# Patient Record
Sex: Male | Born: 1980 | Race: White | Hispanic: No | Marital: Single | Smoking: Current every day smoker
Health system: Southern US, Community
[De-identification: ages and names within clinical notes are randomized; demographics above are authoritative.]

## PROBLEM LIST (undated history)

## (undated) DIAGNOSIS — B1920 Unspecified viral hepatitis C without hepatic coma: Secondary | ICD-10-CM

## (undated) DIAGNOSIS — I471 Supraventricular tachycardia, unspecified: Secondary | ICD-10-CM

## (undated) DIAGNOSIS — I10 Essential (primary) hypertension: Secondary | ICD-10-CM

---

## 2002-08-24 ENCOUNTER — Emergency Department (HOSPITAL_COMMUNITY): Admission: EM | Admit: 2002-08-24 | Discharge: 2002-08-24 | Payer: Self-pay | Admitting: Emergency Medicine

## 2017-07-13 ENCOUNTER — Emergency Department (HOSPITAL_COMMUNITY): Payer: Self-pay

## 2017-07-13 ENCOUNTER — Encounter (HOSPITAL_COMMUNITY): Payer: Self-pay | Admitting: Emergency Medicine

## 2017-07-13 ENCOUNTER — Emergency Department (HOSPITAL_COMMUNITY)
Admission: EM | Admit: 2017-07-13 | Discharge: 2017-07-14 | Disposition: A | Discharge status: Home / Self Care | Payer: Self-pay | Attending: Emergency Medicine | Admitting: Emergency Medicine

## 2017-07-13 DIAGNOSIS — F1510 Other stimulant abuse, uncomplicated: Secondary | ICD-10-CM | POA: Insufficient documentation

## 2017-07-13 DIAGNOSIS — K920 Hematemesis: Secondary | ICD-10-CM | POA: Insufficient documentation

## 2017-07-13 DIAGNOSIS — F17210 Nicotine dependence, cigarettes, uncomplicated: Secondary | ICD-10-CM | POA: Insufficient documentation

## 2017-07-13 DIAGNOSIS — F419 Anxiety disorder, unspecified: Secondary | ICD-10-CM | POA: Insufficient documentation

## 2017-07-13 DIAGNOSIS — R000 Tachycardia, unspecified: Secondary | ICD-10-CM | POA: Insufficient documentation

## 2017-07-13 DIAGNOSIS — R112 Nausea with vomiting, unspecified: Secondary | ICD-10-CM

## 2017-07-13 LAB — I-STAT TROPONIN, ED: TROPONIN I, POC: 0 ng/mL (ref 0.00–0.08)

## 2017-07-13 LAB — I-STAT BETA HCG BLOOD, ED (MC, WL, AP ONLY): I-stat hCG, quantitative: <5 m[IU]/mL (ref <5)

## 2017-07-13 LAB — BASIC METABOLIC PANEL
ANION GAP: 11 (ref 5–15)
BUN: 11 mg/dL (ref 6–20)
CALCIUM: 9.4 mg/dL (ref 8.9–10.3)
CO2: 26 mmol/L (ref 22–32)
CREATININE: 0.88 mg/dL (ref 0.61–1.24)
Chloride: 99 mmol/L — ABNORMAL LOW (ref 101–111)
GFR calc Af Amer: >60 mL/min (ref >60)
GLUCOSE: 109 mg/dL — AB (ref 65–99)
Potassium: 3.8 mmol/L (ref 3.5–5.1)
Sodium: 136 mmol/L (ref 135–145)

## 2017-07-13 LAB — CBC
HCT: 43.6 % (ref 39.0–52.0)
Hemoglobin: 15.3 g/dL (ref 13.0–17.0)
MCH: 30.2 pg (ref 26.0–34.0)
MCHC: 35.1 g/dL (ref 30.0–36.0)
MCV: 86.2 fL (ref 78.0–100.0)
PLATELETS: 264 10*3/uL (ref 150–400)
RBC: 5.06 MIL/uL (ref 4.22–5.81)
RDW: 12 % (ref 11.5–15.5)
WBC: 7 10*3/uL (ref 4.0–10.5)

## 2017-07-13 MED ORDER — IOPAMIDOL (ISOVUE-300) INJECTION 61%
INTRAVENOUS | Status: AC
  Administered 2017-07-13: 75 mL
  Filled 2017-07-13: qty 75

## 2017-07-13 MED ORDER — SODIUM CHLORIDE 0.9 % IV BOLUS (SEPSIS)
1000.0000 mL | Freq: Once | INTRAVENOUS | Status: AC
  Administered 2017-07-13: 1000 mL via INTRAVENOUS

## 2017-07-13 MED ORDER — ONDANSETRON 4 MG PO TBDP
4.0000 mg | ORAL_TABLET | Freq: Once | ORAL | Status: AC
  Administered 2017-07-13: 4 mg via ORAL
  Filled 2017-07-13: qty 1

## 2017-07-13 MED ORDER — ONDANSETRON HCL 4 MG/2ML IJ SOLN
4.0000 mg | Freq: Once | INTRAMUSCULAR | Status: AC
  Administered 2017-07-13: 4 mg via INTRAVENOUS
  Filled 2017-07-13: qty 2

## 2017-07-13 NOTE — ED Triage Notes (Signed)
Reports having sudden onset chest pain in center of the chest with nausea and vomiting.  Noted to be vomiting in triage.

## 2017-07-13 NOTE — ED Notes (Signed)
Pt reports doing some meth this afternoon and now he is vomiting with blood.

## 2017-07-14 LAB — RAPID URINE DRUG SCREEN, HOSP PERFORMED
AMPHETAMINES: POSITIVE — AB
BARBITURATES: NOT DETECTED
Benzodiazepines: NOT DETECTED
Cocaine: NOT DETECTED
OPIATES: NOT DETECTED
TETRAHYDROCANNABINOL: NOT DETECTED

## 2017-07-14 MED ORDER — ONDANSETRON 4 MG PO TBDP: 4.0000 mg | ORAL_TABLET | Freq: Three times a day (TID) | ORAL | 0 refills | Status: DC | PRN

## 2017-07-14 MED ORDER — FAMOTIDINE 40 MG PO TABS: 40.0000 mg | ORAL_TABLET | Freq: Every day | ORAL | 0 refills | Status: DC

## 2017-07-14 NOTE — ED Provider Notes (Signed)
[facility] EMERGENCY DEPARTMENT Provider Note   CSN: [id number] Arrival date & time: 07/13/17  1953     History   Chief Complaint Chief Complaint  Patient presents with  . Chest Pain    HPI Alexander Mckenzie is a 37 y.o. male.  HPI Patient presents with chest pain nausea and vomiting.  Has been vomiting up some blood also.  Began after he took methamphetamine.  Dull chest pain.  States he feels his heart racing.  No fevers or chills.  No other bleeding.  Has not had episodes like this before.  No known heart disease. History reviewed. No pertinent past medical history.  There are no active problems to display for this patient.   History reviewed. No pertinent surgical history.     Home Medications    Prior to Admission medications   Medication Sig Start Date End Date Taking? Authorizing Provider  famotidine (PEPCID) 40 MG tablet Take 1 tablet (40 mg total) by mouth daily. 07/14/17   Benjiman Core, MD  ondansetron (ZOFRAN-ODT) 4 MG disintegrating tablet Take 1 tablet (4 mg total) by mouth every 8 (eight) hours as needed for nausea or vomiting. 07/14/17   Benjiman Core, MD    Family History No family history on file.  Social History Social History   Tobacco Use  . Smoking status: Current Every Day Smoker    Types: Cigarettes  . Smokeless tobacco: Never Used  Substance Use Topics  . Alcohol use: No    Frequency: Never  . Drug use: Yes    Types: Marijuana     Allergies   Keflex [cephalexin]   Review of Systems Review of Systems  Constitutional: Negative for activity change and fever.  HENT: Negative for congestion.   Respiratory: Negative for shortness of breath.   Cardiovascular: Positive for chest pain.  Endocrine: Negative for polyuria.  Genitourinary: Negative for flank pain.  Musculoskeletal: Negative for back pain.  Skin: Negative for rash.  Neurological: Negative for syncope.  Hematological: Negative for adenopathy.    Psychiatric/Behavioral: Negative for confusion.     Physical Exam Updated Vital Signs BP (!) 156/71   Pulse (!) 43   Temp 99 F (37.2 C) (Oral)   Resp 19   Ht 6\' 3"  (1.905 m)   Wt 113.4 kg (250 lb)   SpO2 90%   BMI 31.25 kg/m   Physical Exam  Constitutional: He appears well-developed.  HENT:  Head: Atraumatic.  Eyes: Pupils are equal, round, and reactive to light.  Neck: Neck supple.  Cardiovascular: Normal pulses.  Mild tachycardia  Pulmonary/Chest: Effort normal and breath sounds normal.  Musculoskeletal:       Right lower leg: He exhibits no edema.       Left lower leg: He exhibits no edema.  Neurological: He is alert.  Skin: Skin is warm. Capillary refill takes less than 2 seconds.  Psychiatric: His mood appears anxious.     ED Treatments / Results  Labs (all labs ordered are listed, but only abnormal results are displayed) Labs Reviewed  BASIC METABOLIC PANEL - Abnormal; Notable for the following components:      Result Value   Chloride 99 (*)    Glucose, Bld 109 (*)    All other components within normal limits  CBC  RAPID URINE DRUG SCREEN, HOSP PERFORMED  I-STAT TROPONIN, ED  I-STAT BETA HCG BLOOD, ED ([facility], [facility], [facility] ONLY)    EKG  EKG Interpretation  Date/Time:  Sunday July 13 2017 19:55:42  EDT Ventricular Rate:  129 PR Interval:  150 QRS Duration: 86 QT Interval:  304 QTC Calculation: 445 R Axis:   72 Text Interpretation:  Sinus tachycardia Otherwise normal ECG Confirmed by Benjiman CorePickering, Nesta Kimple [phone]([id number]) on 07/14/2017 12:06:54 AM       Radiology Dg Chest 2 View  Result Date: 07/13/2017 CLINICAL DATA:  37 year old male with chest pain. EXAM: CHEST - 2 VIEW COMPARISON:  None. FINDINGS: The heart size and mediastinal contours are within normal limits. Both lungs are clear. The visualized skeletal structures are unremarkable. IMPRESSION: No active cardiopulmonary disease. Electronically Signed   By: Elgie CollardArash  Radparvar M.D.   On: 07/13/2017 20:36   Ct  Chest W Contrast  Result Date: 07/13/2017 CLINICAL DATA:  37 year old male with chest pain, nausea vomiting, and hematemesis. EXAM: CT CHEST WITH CONTRAST TECHNIQUE: Multidetector CT imaging of the chest was performed during intravenous contrast administration. CONTRAST:  75mL ISOVUE-300 IOPAMIDOL (ISOVUE-300) INJECTION 61% COMPARISON:  Chest radiograph dated 07/13/2017 FINDINGS: Cardiovascular: There is no cardiomegaly or pericardial effusion. Coronary vascular calcification primarily involving the LAD noted which is advanced for the patient's age. Clinical correlation is recommended. The thoracic aorta and the central pulmonary arteries appear unremarkable. Evaluation for pulmonary embolus is very limited due to suboptimal opacification. If there is clinical concern for PE further evaluation with V/Q scan is recommended as this study is nondiagnostic for PE. Mediastinum/Nodes: There is no hilar or mediastinal adenopathy. Esophagus and the thyroid gland are grossly unremarkable. No mediastinal fluid collection. Lungs/Pleura: There is a 4 mm subpleural nodule or scarring along the minor fissure (series 4 image 75). The lungs are clear. There is no pleural effusion or pneumothorax. The central airways are patent. Upper Abdomen: No acute abnormality. Musculoskeletal: No chest wall abnormality. No acute or significant osseous findings. IMPRESSION: No acute intrathoracic pathology. Nondiagnostic exam for evaluation of pulmonary arteries. If there is clinical concern for PE further evaluation with V/Q scan is recommended. Coronary vascular calcifications of the LAD, advanced for the patient's age. Clinical correlation is recommended. Electronically Signed   By: Elgie CollardArash  Radparvar M.D.   On: 07/13/2017 23:43    Procedures Procedures (including critical care time)  Medications Ordered in ED Medications  ondansetron (ZOFRAN-ODT) disintegrating tablet 4 mg (4 mg Oral Given 07/13/17 2008)  ondansetron (ZOFRAN)  injection 4 mg (4 mg Intravenous Given 07/13/17 2219)  sodium chloride 0.9 % bolus 1,000 mL (0 mLs Intravenous Stopped 07/13/17 2326)  iopamidol (ISOVUE-300) 61 % injection (75 mLs  Contrast Given 07/13/17 2313)     Initial Impression / Assessment and Plan / ED Course  I have reviewed the triage vital signs and the nursing notes.  Pertinent labs & imaging results that were available during my care of the patient were reviewed by me and considered in my medical decision making (see chart for details).    Patient with chest pain nausea and vomiting.  Began after methamphetamine use.  Vitals improved with time.  Hemoglobin and labs reassuring.  EKG reassuring.  CT scan done due to hematemesis to rule out a Boerhaave's.  But no evidence of Boerhaave's on the scan.  Patient was informed about his LAD calcifications.  Will discharge home.  Will treat with Zofran and Pepcid for his gastritis versus Mallory-Weiss   Final Clinical Impressions(s) / ED Diagnoses   Final diagnoses:  Hematemesis, presence of nausea not specified  Nausea and vomiting, intractability of vomiting not specified, unspecified vomiting type  Methamphetamine abuse [facility])    ED Discharge Orders  Ordered    famotidine (PEPCID) 40 MG tablet  Daily     07/14/17 0002    ondansetron (ZOFRAN-ODT) 4 MG disintegrating tablet  Every 8 hours PRN     07/14/17 0002       Benjiman Core, MD 07/14/17 0008

## 2018-11-15 DIAGNOSIS — R000 Tachycardia, unspecified: Secondary | ICD-10-CM | POA: Insufficient documentation

## 2018-12-28 IMAGING — CT CT CHEST W/ CM
2 of 4 series · 15 of 36 positions shown, 18 images · IV contrast (APPLIED)
Comparison: Chest radiograph dated 07/13/2017

CLINICAL DATA: 36-year-old male with chest pain, nausea vomiting,
and hematemesis.

EXAM:
CT CHEST WITH CONTRAST
TECHNIQUE: Multidetector CT imaging of the chest was performed during
intravenous contrast administration.
CONTRAST:  75mL FFCSUQ-322 IOPAMIDOL (FFCSUQ-322) INJECTION 61%

[Series 3: chest w · axial · 0.83mm/px · z∈[-185,+115]mm · 12 of 176 slices shown, 15 images]
[im 13/176  mediastinal]
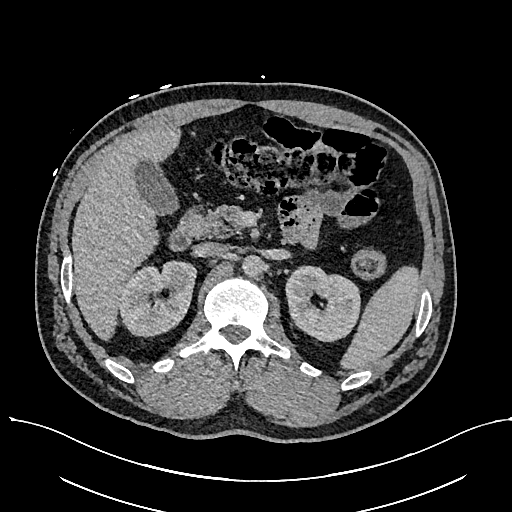
[im 13/176  lung]
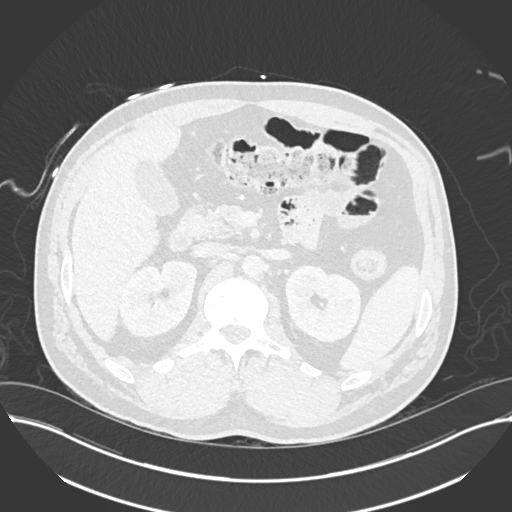
[im 26/176  lung]
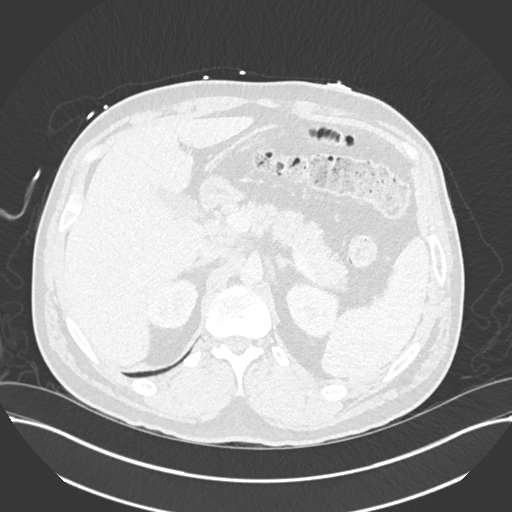
[im 38/176  lung]
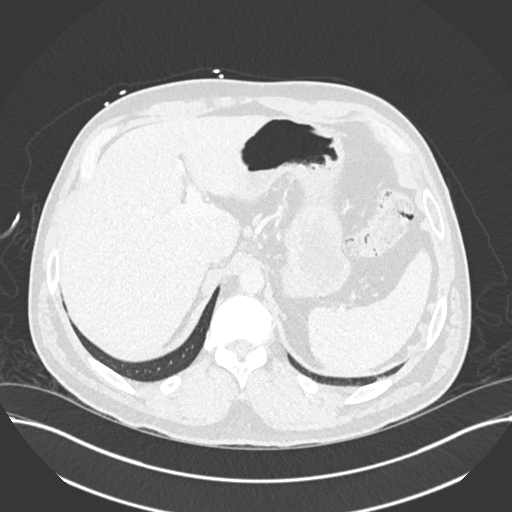
[im 51/176  lung]
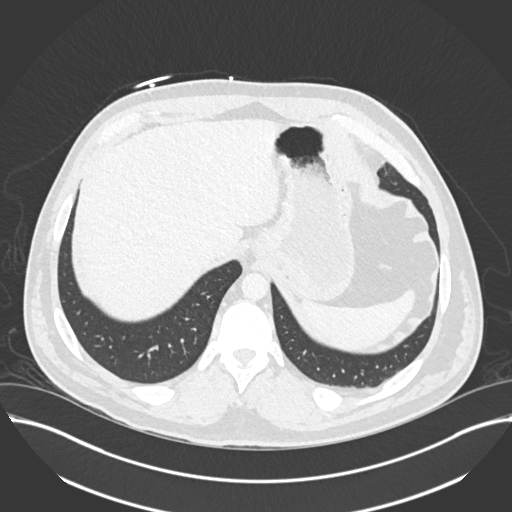
[im 63/176  mediastinal]
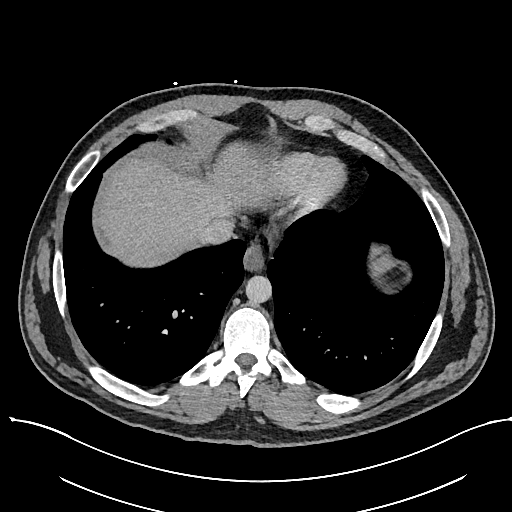
[im 63/176  lung]
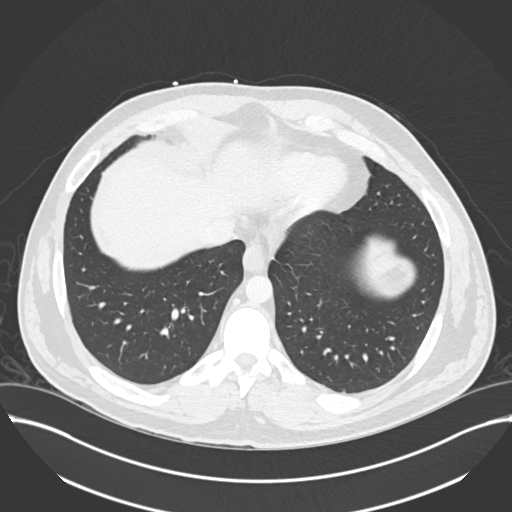
[im 76/176  lung]
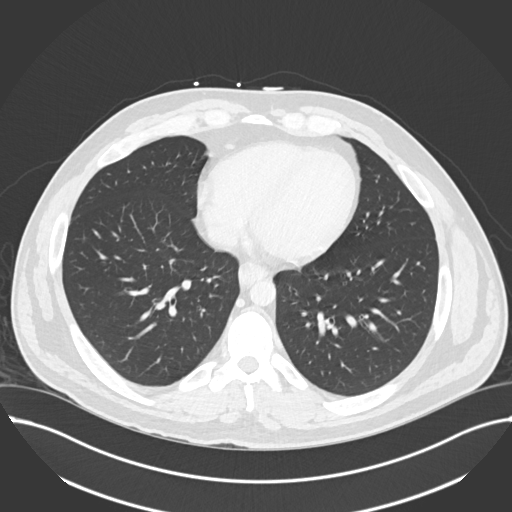
[im 101/176  lung]
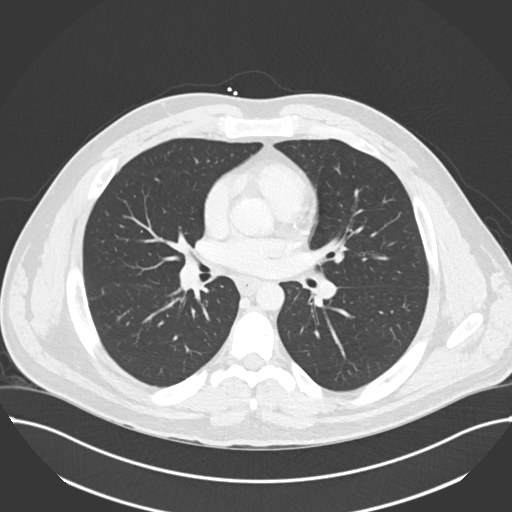
[im 113/176  lung]
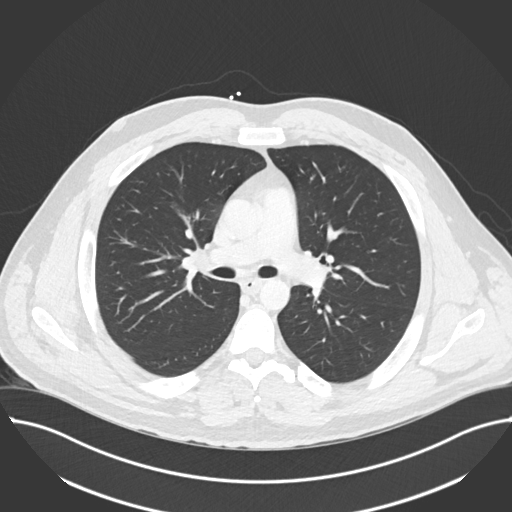
[im 126/176  mediastinal]
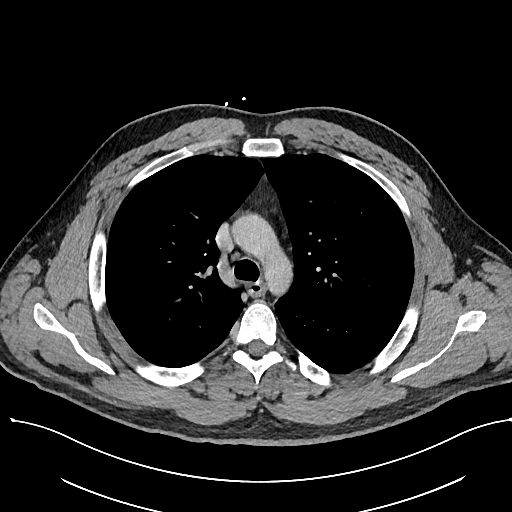
[im 126/176  lung]
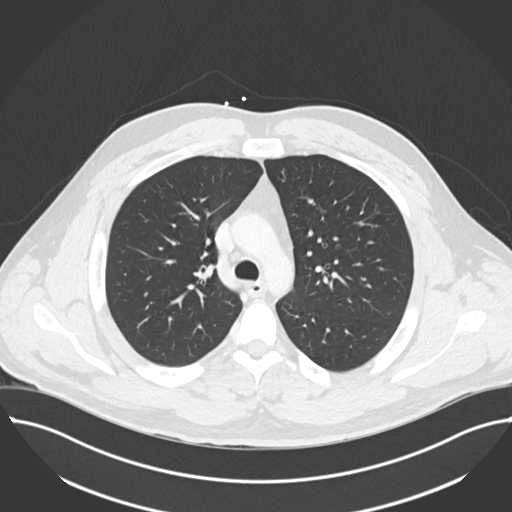
[im 138/176  lung]
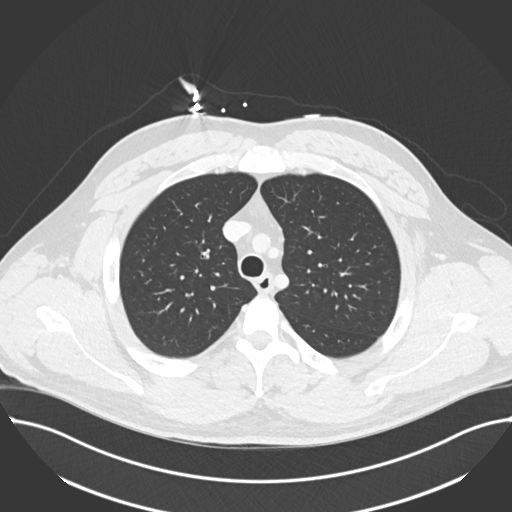
[im 151/176  lung]
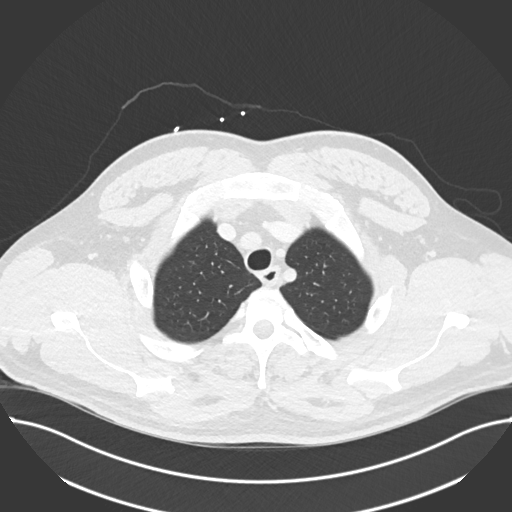
[im 163/176  lung]
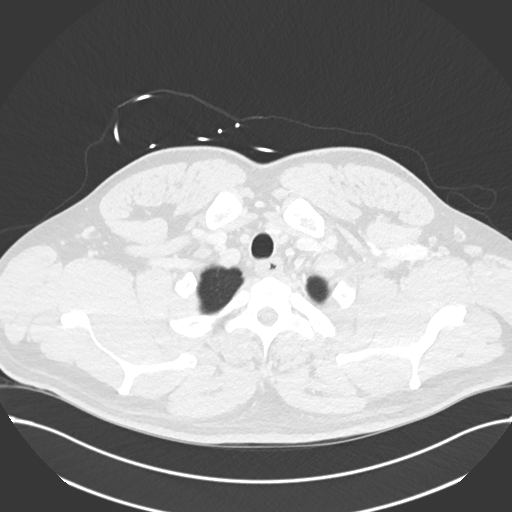

[Series 6: cor · coronal · 0.71mm/px · 3 of 142 slices shown]
[im 29/142  lung]
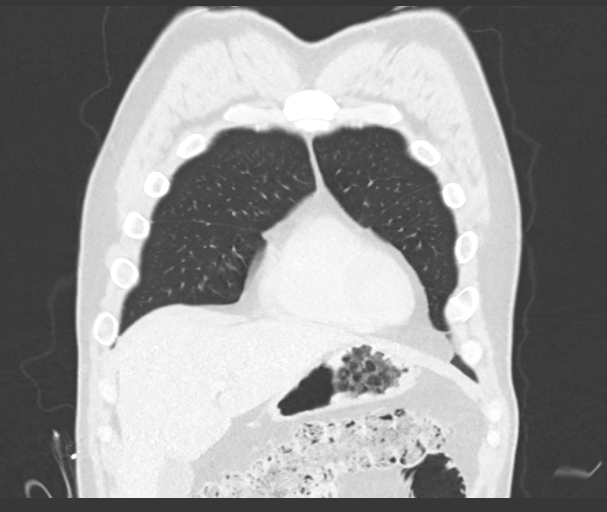
[im 57/142  lung]
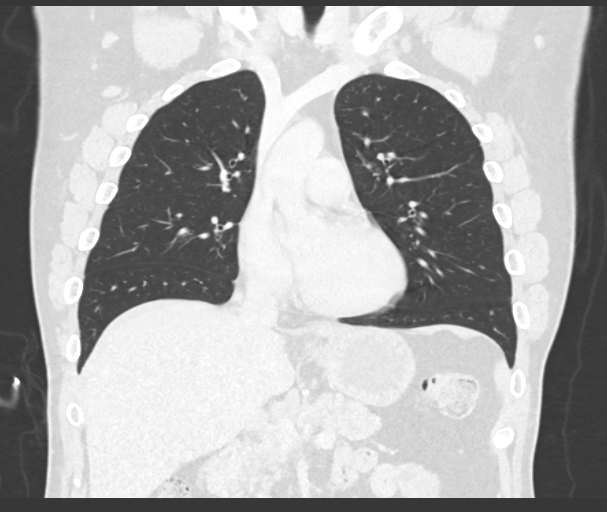
[im 85/142  lung]
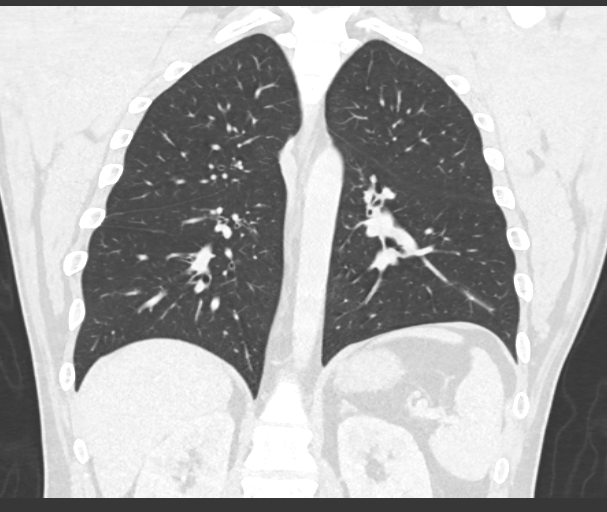

[15 of 36 positions shown; findings below may reference images not displayed]

FINDINGS: Cardiovascular: There is no cardiomegaly or pericardial effusion.
Coronary vascular calcification primarily involving the LAD noted
which is advanced for the patient's age. Clinical correlation is
recommended. The thoracic aorta and the central pulmonary arteries
appear unremarkable. Evaluation for pulmonary embolus is very
limited due to suboptimal opacification. If there is clinical
concern for PE further evaluation with V/Q scan is recommended as
this study is nondiagnostic for PE.

Mediastinum/Nodes: There is no hilar or mediastinal adenopathy.
Esophagus and the thyroid gland are grossly unremarkable. No
mediastinal fluid collection.

Lungs/Pleura: There is a 4 mm subpleural nodule or scarring along
the minor fissure (series 4 image 75). The lungs are clear. There is
no pleural effusion or pneumothorax. The central airways are patent.

Upper Abdomen: No acute abnormality.

Musculoskeletal: No chest wall abnormality. No acute or significant
osseous findings.
IMPRESSION: No acute intrathoracic pathology. Nondiagnostic exam for evaluation
of pulmonary arteries. If there is clinical concern for PE further
evaluation with V/Q scan is recommended.

Coronary vascular calcifications of the LAD, advanced for the
patient's age. Clinical correlation is recommended.

## 2018-12-28 IMAGING — DX DG CHEST 2V
2 series · 2 of 2 positions shown · non-contrast
Comparison: None.

CLINICAL DATA: 36-year-old female with chest pain.

EXAM:
CHEST - 2 VIEW

[chest pa]
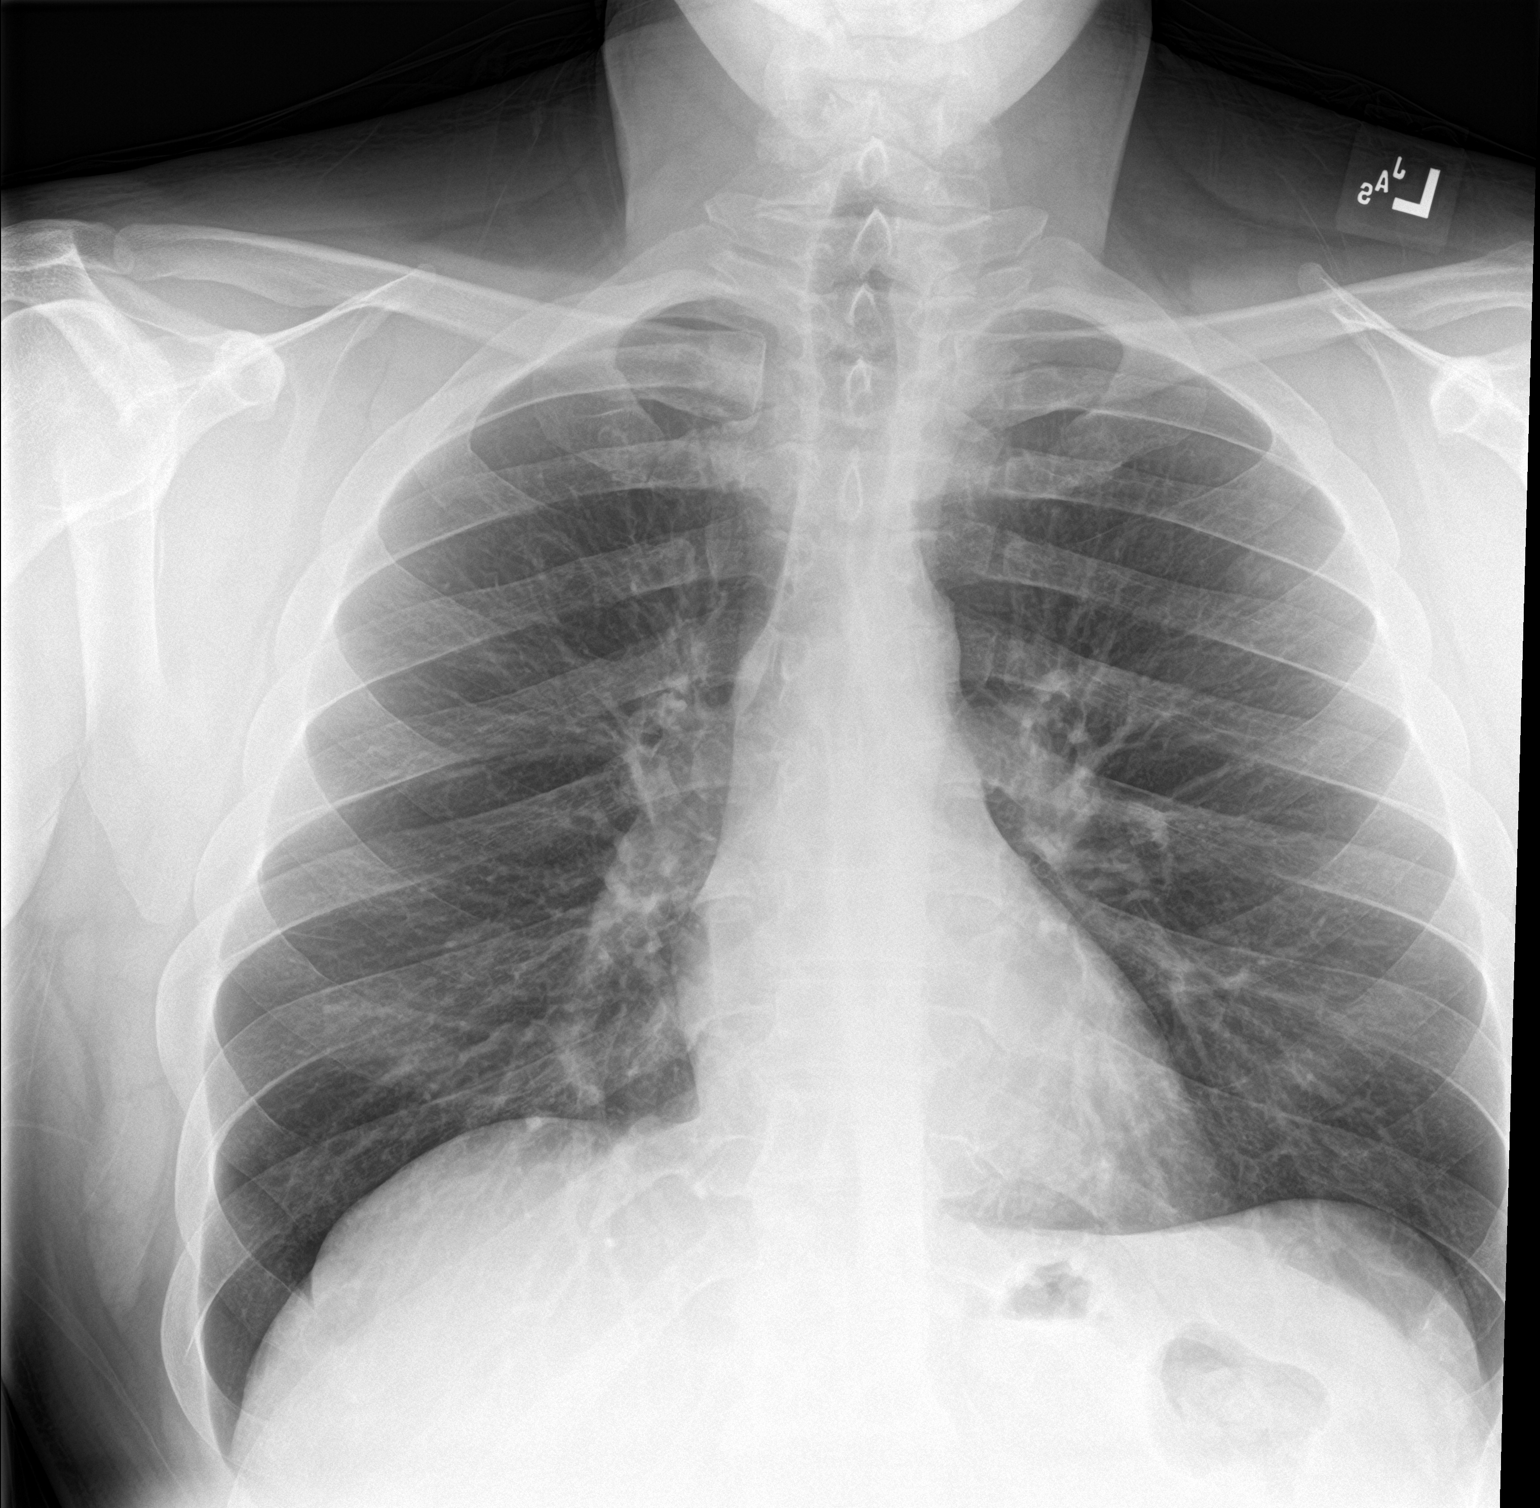

[chest lat]
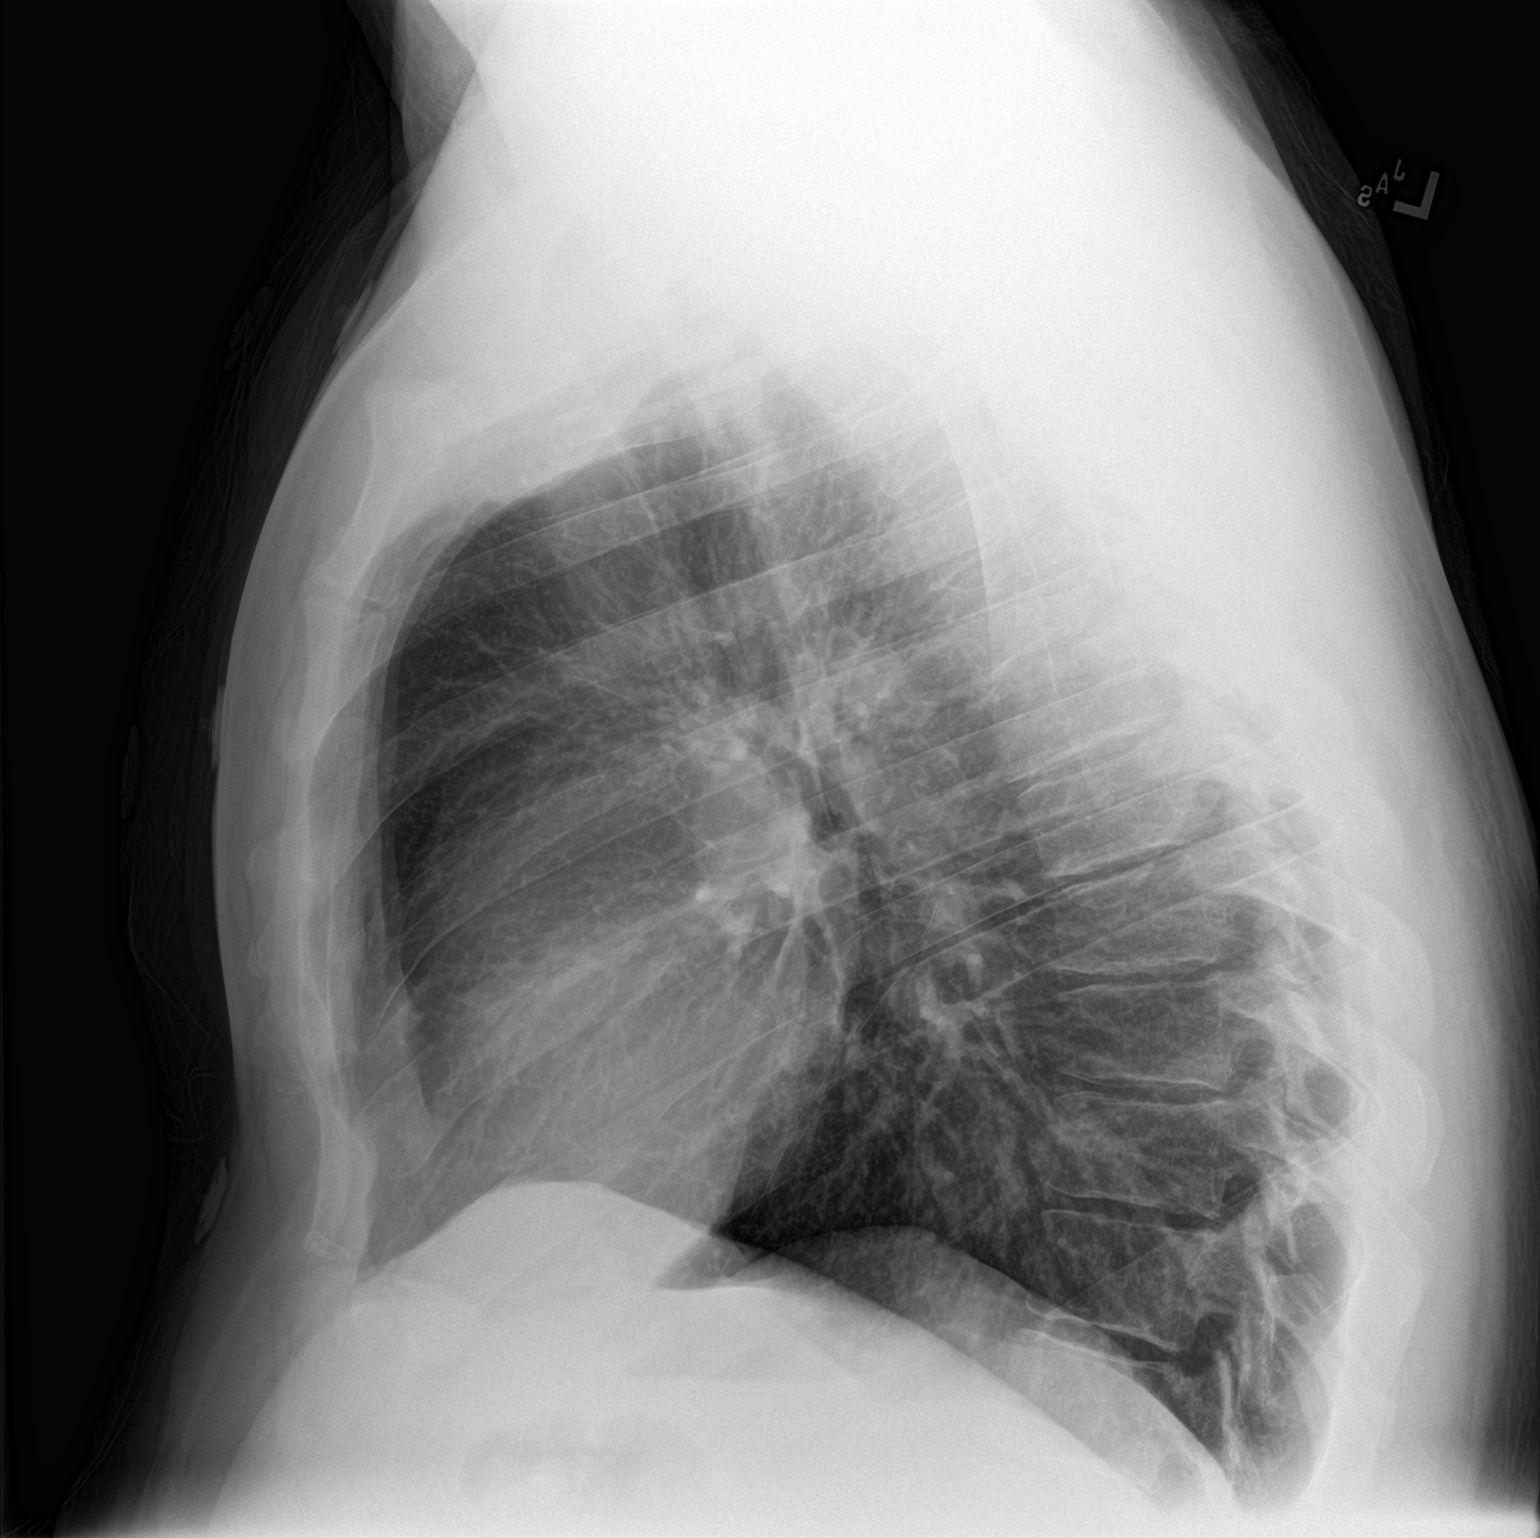

[2 of 2 positions shown; findings below may reference images not displayed]

FINDINGS: The heart size and mediastinal contours are within normal limits.
Both lungs are clear. The visualized skeletal structures are
unremarkable.
IMPRESSION: No active cardiopulmonary disease.

## 2020-01-10 DIAGNOSIS — R4182 Altered mental status, unspecified: Secondary | ICD-10-CM | POA: Insufficient documentation

## 2020-01-10 DIAGNOSIS — K37 Unspecified appendicitis: Secondary | ICD-10-CM | POA: Insufficient documentation

## 2023-04-21 ENCOUNTER — Other Ambulatory Visit: Payer: Self-pay

## 2023-04-21 ENCOUNTER — Encounter (HOSPITAL_COMMUNITY): Payer: Self-pay

## 2023-04-21 ENCOUNTER — Emergency Department (HOSPITAL_COMMUNITY)
Admission: EM | Admit: 2023-04-21 | Discharge: 2023-04-21 | Disposition: A | Discharge status: Home / Self Care | Payer: Self-pay | Attending: Emergency Medicine | Admitting: Emergency Medicine

## 2023-04-21 ENCOUNTER — Emergency Department (HOSPITAL_BASED_OUTPATIENT_CLINIC_OR_DEPARTMENT_OTHER): Payer: Self-pay

## 2023-04-21 ENCOUNTER — Emergency Department (HOSPITAL_COMMUNITY): Payer: Self-pay

## 2023-04-21 DIAGNOSIS — R739 Hyperglycemia, unspecified: Secondary | ICD-10-CM | POA: Insufficient documentation

## 2023-04-21 DIAGNOSIS — I10 Essential (primary) hypertension: Secondary | ICD-10-CM | POA: Insufficient documentation

## 2023-04-21 DIAGNOSIS — R000 Tachycardia, unspecified: Secondary | ICD-10-CM | POA: Insufficient documentation

## 2023-04-21 DIAGNOSIS — R002 Palpitations: Secondary | ICD-10-CM | POA: Insufficient documentation

## 2023-04-21 DIAGNOSIS — R609 Edema, unspecified: Secondary | ICD-10-CM

## 2023-04-21 DIAGNOSIS — R600 Localized edema: Secondary | ICD-10-CM | POA: Insufficient documentation

## 2023-04-21 DIAGNOSIS — R0789 Other chest pain: Secondary | ICD-10-CM | POA: Insufficient documentation

## 2023-04-21 DIAGNOSIS — R0602 Shortness of breath: Secondary | ICD-10-CM | POA: Insufficient documentation

## 2023-04-21 HISTORY — DX: Essential (primary) hypertension: I10

## 2023-04-21 HISTORY — DX: Unspecified viral hepatitis C without hepatic coma: B19.20

## 2023-04-21 HISTORY — DX: Supraventricular tachycardia, unspecified: I47.10

## 2023-04-21 LAB — CBC WITH DIFFERENTIAL/PLATELET
Abs Immature Granulocytes: 0.01 10*3/uL (ref 0.00–0.07)
Basophils Absolute: 0 10*3/uL (ref 0.0–0.1)
Basophils Relative: 1 %
Eosinophils Absolute: 0.1 10*3/uL (ref 0.0–0.5)
Eosinophils Relative: 2 %
HCT: 34.3 % — ABNORMAL LOW (ref 39.0–52.0)
Hemoglobin: 11.6 g/dL — ABNORMAL LOW (ref 13.0–17.0)
Immature Granulocytes: 0 %
Lymphocytes Relative: 44 %
Lymphs Abs: 1.9 10*3/uL (ref 0.7–4.0)
MCH: 32.6 pg (ref 26.0–34.0)
MCHC: 33.8 g/dL (ref 30.0–36.0)
MCV: 96.3 fL (ref 80.0–100.0)
Monocytes Absolute: 0.5 10*3/uL (ref 0.1–1.0)
Monocytes Relative: 12 %
Neutro Abs: 1.8 10*3/uL (ref 1.7–7.7)
Neutrophils Relative %: 41 %
Platelets: 52 10*3/uL — ABNORMAL LOW (ref 150–400)
RBC: 3.56 MIL/uL — ABNORMAL LOW (ref 4.22–5.81)
RDW: 14.6 % (ref 11.5–15.5)
WBC: 4.3 10*3/uL (ref 4.0–10.5)
nRBC: 0 % (ref 0.0–0.2)

## 2023-04-21 LAB — BASIC METABOLIC PANEL
Anion gap: 5 (ref 5–15)
BUN: 14 mg/dL (ref 6–20)
CO2: 19 mmol/L — ABNORMAL LOW (ref 22–32)
Calcium: 8.2 mg/dL — ABNORMAL LOW (ref 8.9–10.3)
Chloride: 111 mmol/L (ref 98–111)
Creatinine, Ser: 0.96 mg/dL (ref 0.61–1.24)
GFR, Estimated: >60 mL/min (ref >60)
Glucose, Bld: 132 mg/dL — ABNORMAL HIGH (ref 70–99)
Potassium: 3.8 mmol/L (ref 3.5–5.1)
Sodium: 135 mmol/L (ref 135–145)

## 2023-04-21 LAB — D-DIMER, QUANTITATIVE: D-Dimer, Quant: 0.32 ug{FEU}/mL (ref 0.00–0.50)

## 2023-04-21 LAB — TROPONIN I (HIGH SENSITIVITY)
Troponin I (High Sensitivity): 6 ng/L (ref <18)
Troponin I (High Sensitivity): 7 ng/L (ref <18)

## 2023-04-21 LAB — BRAIN NATRIURETIC PEPTIDE: B Natriuretic Peptide: 8 pg/mL (ref 0.0–100.0)

## 2023-04-21 LAB — CK: Total CK: 243 U/L (ref 49–397)

## 2023-04-21 MED ORDER — LORAZEPAM 1 MG PO TABS
1.0000 mg | ORAL_TABLET | Freq: Once | ORAL | Status: AC
  Administered 2023-04-21: 1 mg via ORAL
  Filled 2023-04-21: qty 1

## 2023-04-21 NOTE — ED Triage Notes (Signed)
Pt bib ems from a [employer], pt was recently released from Prison last week after spending 5 years there. Pt was doing some chores this morning and suddenly developed chest pain. Pt has hx of SVT, when fire arrived his Hr was 150, when EMS got there it was down to 110. Pt also reports swelling in his legs x2 weeks, was given compression socks in prison with some relief but states his legs aren't his normal. Pt also c.o full body cramping. States he gets monthly suboxone injections and he is due for one this month. Pt appears anxious.

## 2023-04-21 NOTE — Discharge Instructions (Signed)
It was a pleasure take care of you today.  As discussed, your workup was reassuring.  Your cardiac markers were normal.  Please follow-up with [facility] for further evaluation.  Return to the ER for any worsening symptoms.

## 2023-04-21 NOTE — ED Provider Notes (Signed)
[facility] EMERGENCY DEPARTMENT AT [facility] Provider Note   CSN: [id number] Arrival date & time: 04/21/23  0746     History  Chief Complaint  Patient presents with   Chest Pain   Anxiety    Alexander Mckenzie is a 42 y.o. male with a past medical history significant for hepatitis C, hypertension, history of SVT who presents to the ED due to palpitations and chest pain that started this morning.  Patient states after breakfast he was taking out the trash and developed sudden onset of palpitations.  He describes palpitations as heart racing.  When fire got to patient his heart was in the 150s.  Patient also admits to bilateral lower extremity edema and shortness of breath.  No history of blood clots.  Patient states his whole body is currently cramping.  Chest pain and palpitations have resolved.  Recently in prison and released a few days ago.  Currently in a halfway house.  History obtained from patient and past medical records. No interpreter used during encounter.       Home Medications Prior to Admission medications   Medication Sig Start Date End Date Taking? Authorizing Provider  famotidine (PEPCID) 40 MG tablet Take 1 tablet (40 mg total) by mouth daily. 07/14/17   Benjiman Core, MD  ondansetron (ZOFRAN-ODT) 4 MG disintegrating tablet Take 1 tablet (4 mg total) by mouth every 8 (eight) hours as needed for nausea or vomiting. 07/14/17   Benjiman Core, MD      Allergies    Keflex [cephalexin]    Review of Systems   Review of Systems  Constitutional:  Negative for chills and fever.  Respiratory:  Positive for shortness of breath.   Cardiovascular:  Positive for chest pain, palpitations and leg swelling.  Musculoskeletal:  Positive for myalgias.    Physical Exam Updated Vital Signs BP 108/62   Pulse 85   Temp 98.9 F (37.2 C) (Temporal)   Resp 12   Ht 6\' 4"  (1.93 m)   Wt 108.9 kg   SpO2 97%   BMI 29.21 kg/m  Physical Exam Vitals and nursing note  reviewed.  Constitutional:      General: He is not in acute distress.    Appearance: He is not ill-appearing.  HENT:     Head: Normocephalic.  Eyes:     Pupils: Pupils are equal, round, and reactive to light.  Cardiovascular:     Rate and Rhythm: Regular rhythm. Tachycardia present.     Pulses: Normal pulses.     Heart sounds: Normal heart sounds. No murmur heard.    No friction rub. No gallop.  Pulmonary:     Effort: Pulmonary effort is normal.     Breath sounds: Normal breath sounds.     Comments: Respirations equal and unlabored, patient able to speak in full sentences, lungs clear to auscultation bilaterally Abdominal:     General: Abdomen is flat. There is no distension.     Palpations: Abdomen is soft.     Tenderness: There is no abdominal tenderness. There is no guarding or rebound.  Musculoskeletal:        General: Normal range of motion.     Cervical back: Neck supple.     Comments: 2+ pitting edema bilaterally  Skin:    General: Skin is warm and dry.  Neurological:     General: No focal deficit present.     Mental Status: He is alert.  Psychiatric:  Mood and Affect: Mood normal.        Behavior: Behavior normal.     ED Results / Procedures / Treatments   Labs (all labs ordered are listed, but only abnormal results are displayed) Labs Reviewed  CBC WITH DIFFERENTIAL/PLATELET - Abnormal; Notable for the following components:      Result Value   RBC 3.56 (*)    Hemoglobin 11.6 (*)    HCT 34.3 (*)    Platelets 52 (*)    All other components within normal limits  BASIC METABOLIC PANEL - Abnormal; Notable for the following components:   CO2 19 (*)    Glucose, Bld 132 (*)    Calcium 8.2 (*)    All other components within normal limits  BRAIN NATRIURETIC PEPTIDE  CK  D-DIMER, QUANTITATIVE  TROPONIN I (HIGH SENSITIVITY)  TROPONIN I (HIGH SENSITIVITY)    EKG EKG Interpretation Date/Time:  Monday April 21 2023 07:47:16 EST Ventricular Rate:   121 PR Interval:  142 QRS Duration:  86 QT Interval:  340 QTC Calculation: 482 R Axis:   53  Text Interpretation: Sinus tachycardia Otherwise normal ECG No significant change was found Confirmed by Elayne Snare ([id number]) on 04/21/2023 9:00:16 AM  Radiology VAS [place] LOWER EXTREMITY VENOUS (DVT) Result Date: 04/21/2023  Lower Venous DVT Study Patient Name:  Alexander Mckenzie  Date of Exam:   04/21/2023 Medical Rec #: [id number]     Accession #:    [id number] Date of Birth: 1980/07/24     Patient Gender: M Patient Age:   32 years Exam Location:  [facility] Procedure:      VAS [place] LOWER EXTREMITY VENOUS (DVT) Referring Phys: Claudette Stapler --------------------------------------------------------------------------------  Indications: Edema, chest pain, and Swelling. Other Indications: H/O SVT. Comparison Study: No prior exam. Performing Technologist: Fernande Bras  Examination Guidelines: A complete evaluation includes B-mode imaging, spectral Doppler, color Doppler, and power Doppler as needed of all accessible portions of each vessel. Bilateral testing is considered an integral part of a complete examination. Limited examinations for reoccurring indications may be performed as noted. The reflux portion of the exam is performed with the patient in reverse Trendelenburg.  +---------+---------------+---------+-----------+----------+--------------+ RIGHT    CompressibilityPhasicitySpontaneityPropertiesThrombus Aging +---------+---------------+---------+-----------+----------+--------------+ CFV      Full           Yes      Yes                                 +---------+---------------+---------+-----------+----------+--------------+ SFJ      Full           Yes      Yes                                 +---------+---------------+---------+-----------+----------+--------------+ FV Prox  Full                                                         +---------+---------------+---------+-----------+----------+--------------+ FV Mid   Full                                                        +---------+---------------+---------+-----------+----------+--------------+  FV DistalFull                                                        +---------+---------------+---------+-----------+----------+--------------+ PFV      Full                                                        +---------+---------------+---------+-----------+----------+--------------+ POP      Full           Yes      Yes                                 +---------+---------------+---------+-----------+----------+--------------+ PTV      Full                                                        +---------+---------------+---------+-----------+----------+--------------+ PERO     Full                                                        +---------+---------------+---------+-----------+----------+--------------+   +---------+---------------+---------+-----------+----------+--------------+ LEFT     CompressibilityPhasicitySpontaneityPropertiesThrombus Aging +---------+---------------+---------+-----------+----------+--------------+ CFV      Full           Yes      Yes                                 +---------+---------------+---------+-----------+----------+--------------+ SFJ      Full           Yes      Yes                                 +---------+---------------+---------+-----------+----------+--------------+ FV Prox  Full                                                        +---------+---------------+---------+-----------+----------+--------------+ FV Mid   Full                                                        +---------+---------------+---------+-----------+----------+--------------+ FV DistalFull                                                         +---------+---------------+---------+-----------+----------+--------------+  PFV      Full                                                        +---------+---------------+---------+-----------+----------+--------------+ POP      Full           Yes      Yes                                 +---------+---------------+---------+-----------+----------+--------------+ PTV      Full                                                        +---------+---------------+---------+-----------+----------+--------------+ PERO     Full                                                        +---------+---------------+---------+-----------+----------+--------------+     Summary: BILATERAL: - No evidence of deep vein thrombosis seen in the lower extremities, bilaterally. -No evidence of popliteal cyst, bilaterally.   *See table(s) above for measurements and observations.    Preliminary    DG Chest 1 View Result Date: 04/21/2023 CLINICAL DATA:  [id number] Chest pain [id number] EXAM: CHEST  1 VIEW COMPARISON:  07/13/2017 FINDINGS: The heart size and mediastinal contours are within normal limits. Both lungs are clear. The visualized skeletal structures are unremarkable. IMPRESSION: No active disease. Electronically Signed   By: Judie Petit.  Shick M.D.   On: 04/21/2023 08:44    Procedures Procedures    Medications Ordered in ED Medications  LORazepam (ATIVAN) tablet 1 mg (1 mg Oral Given 04/21/23 0845)    ED Course/ Medical Decision Making/ A&P Clinical Course as of 04/21/23 1435  Mon Apr 21, 2023  1057 D-Dimer, Quant: 0.32 [CA]  1057 CK Total: 243 [CA]  1400 Called lab to inquire about second troponin.  Lab notes troponin has roughly 10 more minutes left until results. [CA]    Clinical Course User Index [CA] Mannie Stabile, PA-C                                 Medical Decision Making Amount and/or Complexity of Data Reviewed Independent Historian: EMS Labs: ordered. Decision-making details  documented in ED Course. Radiology: ordered and independent interpretation performed. Decision-making details documented in ED Course. ECG/medicine tests: ordered and independent interpretation performed. Decision-making details documented in ED Course.  Risk Prescription drug management.   This patient presents to the ED for concern of CP/palpitations, this involves an extensive number of treatment options, and is a complaint that carries with it a high risk of complications and morbidity.  The differential diagnosis includes arrhythmia, anxiety, PE, ACS, etc  42 year old male presents to the ED due to chest pain and palpitations that started prior to arrival.  Previous history of SVT.  No history of blood clots.  Also endorses shortness of  breath and lower extremity edema.  Upon arrival patient afebrile, tachycardic at 105 with normal O2 saturation.  Patient in no acute distress.  2+ pitting edema bilaterally.  Lungs clear to auscultation bilaterally.  Routine labs ordered.  D-dimer to rule out PE.  Ultrasounds rule out DVT.  Ativan given.  CBC reassuring.  No leukocytosis.  Hemoglobin 11.6.  BMP with hyperglycemia at 132.  No anion gap.  Low suspicion for DKA.  Normal renal function. CK normal. BNP normal. Low suspicion for CHF.  D-dimer normal.  Low suspicion for PE.  Troponin x 2 normal. EKG sinus tachycardia.  No evidence of ischemia.  Low suspicion for ACS.  [place] negative for DVT.  Chest x-ray personally reviewed and interpreted which is negative for signs of pneumonia, pneumothorax or widened mediastinum.  Upon reassessment after Ativan patient notes improvement in symptoms.  Possible anxiety etiology? Vs SVT?  Patient was on cardiac monitoring throughout his entire ED stay with no evidence of arrhythmias.  Patient stable for discharge with PCP follow-up. Strict ED precautions discussed with patient. Patient states understanding and agrees to plan. Patient discharged home in no acute distress and  stable vitals  Co morbidities that complicate the patient evaluation  HTN, hx SVT Cardiac Monitoring: / EKG:  The patient was maintained on a cardiac monitor.  I personally viewed and interpreted the cardiac monitored which showed an underlying rhythm of: sinus tachycardia  Social Determinants of Health:  Recent incarceration  Test / Admission - Considered:  CTA chest; however PE thought to be less likely given normal d-dimer       Final Clinical Impression(s) / ED Diagnoses Final diagnoses:  Atypical chest pain    Rx / DC Orders ED Discharge Orders     None         Mannie Stabile, PA-C 04/21/23 1435    Rexford Maus, DO 04/21/23 1510

## 2023-05-29 ENCOUNTER — Encounter (HOSPITAL_COMMUNITY): Payer: Self-pay | Admitting: Emergency Medicine

## 2023-05-29 ENCOUNTER — Other Ambulatory Visit: Payer: Self-pay

## 2023-05-29 ENCOUNTER — Emergency Department (HOSPITAL_COMMUNITY)

## 2023-05-29 ENCOUNTER — Inpatient Hospital Stay (HOSPITAL_COMMUNITY)
Admission: EM | Admit: 2023-05-29 | Discharge: 2023-06-02 | DRG: 391 | Disposition: A | Discharge status: Home / Self Care | Attending: Family Medicine | Admitting: Family Medicine

## 2023-05-29 DIAGNOSIS — A419 Sepsis, unspecified organism: Secondary | ICD-10-CM | POA: Clinically undetermined

## 2023-05-29 DIAGNOSIS — K7031 Alcoholic cirrhosis of liver with ascites: Secondary | ICD-10-CM | POA: Diagnosis present

## 2023-05-29 DIAGNOSIS — D65 Disseminated intravascular coagulation [defibrination syndrome]: Secondary | ICD-10-CM | POA: Diagnosis present

## 2023-05-29 DIAGNOSIS — R109 Unspecified abdominal pain: Secondary | ICD-10-CM | POA: Diagnosis present

## 2023-05-29 DIAGNOSIS — K529 Noninfective gastroenteritis and colitis, unspecified: Principal | ICD-10-CM | POA: Diagnosis present

## 2023-05-29 DIAGNOSIS — D61818 Other pancytopenia: Secondary | ICD-10-CM | POA: Diagnosis present

## 2023-05-29 DIAGNOSIS — I959 Hypotension, unspecified: Secondary | ICD-10-CM | POA: Diagnosis not present

## 2023-05-29 DIAGNOSIS — R6510 Systemic inflammatory response syndrome (SIRS) of non-infectious origin without acute organ dysfunction: Secondary | ICD-10-CM

## 2023-05-29 DIAGNOSIS — R161 Splenomegaly, not elsewhere classified: Secondary | ICD-10-CM | POA: Diagnosis present

## 2023-05-29 DIAGNOSIS — R7989 Other specified abnormal findings of blood chemistry: Secondary | ICD-10-CM | POA: Diagnosis present

## 2023-05-29 DIAGNOSIS — M7989 Other specified soft tissue disorders: Secondary | ICD-10-CM

## 2023-05-29 DIAGNOSIS — D696 Thrombocytopenia, unspecified: Secondary | ICD-10-CM

## 2023-05-29 DIAGNOSIS — K766 Portal hypertension: Secondary | ICD-10-CM | POA: Diagnosis present

## 2023-05-29 DIAGNOSIS — F1510 Other stimulant abuse, uncomplicated: Secondary | ICD-10-CM | POA: Diagnosis present

## 2023-05-29 DIAGNOSIS — F17210 Nicotine dependence, cigarettes, uncomplicated: Secondary | ICD-10-CM | POA: Diagnosis present

## 2023-05-29 DIAGNOSIS — Q5310 Unspecified undescended testicle, unilateral: Secondary | ICD-10-CM

## 2023-05-29 DIAGNOSIS — Z881 Allergy status to other antibiotic agents status: Secondary | ICD-10-CM

## 2023-05-29 DIAGNOSIS — K8000 Calculus of gallbladder with acute cholecystitis without obstruction: Secondary | ICD-10-CM | POA: Diagnosis present

## 2023-05-29 DIAGNOSIS — E8809 Other disorders of plasma-protein metabolism, not elsewhere classified: Secondary | ICD-10-CM | POA: Diagnosis present

## 2023-05-29 DIAGNOSIS — B1920 Unspecified viral hepatitis C without hepatic coma: Secondary | ICD-10-CM | POA: Diagnosis present

## 2023-05-29 DIAGNOSIS — E66811 Obesity, class 1: Secondary | ICD-10-CM | POA: Diagnosis present

## 2023-05-29 DIAGNOSIS — I471 Supraventricular tachycardia, unspecified: Secondary | ICD-10-CM | POA: Diagnosis present

## 2023-05-29 DIAGNOSIS — K7460 Unspecified cirrhosis of liver: Secondary | ICD-10-CM | POA: Diagnosis present

## 2023-05-29 DIAGNOSIS — R1084 Generalized abdominal pain: Principal | ICD-10-CM

## 2023-05-29 DIAGNOSIS — K810 Acute cholecystitis: Secondary | ICD-10-CM

## 2023-05-29 DIAGNOSIS — D72819 Decreased white blood cell count, unspecified: Secondary | ICD-10-CM | POA: Diagnosis present

## 2023-05-29 DIAGNOSIS — E860 Dehydration: Secondary | ICD-10-CM | POA: Diagnosis present

## 2023-05-29 DIAGNOSIS — I10 Essential (primary) hypertension: Secondary | ICD-10-CM | POA: Diagnosis present

## 2023-05-29 HISTORY — DX: Other specified abnormal findings of blood chemistry: R79.89

## 2023-05-29 LAB — CBC
HCT: 36.5 % — ABNORMAL LOW (ref 39.0–52.0)
Hemoglobin: 12.4 g/dL — ABNORMAL LOW (ref 13.0–17.0)
MCH: 32.6 pg (ref 26.0–34.0)
MCHC: 34 g/dL (ref 30.0–36.0)
MCV: 96.1 fL (ref 80.0–100.0)
Platelets: 48 10*3/uL — ABNORMAL LOW (ref 150–400)
RBC: 3.8 MIL/uL — ABNORMAL LOW (ref 4.22–5.81)
RDW: 14.4 % (ref 11.5–15.5)
WBC: 2.7 10*3/uL — ABNORMAL LOW (ref 4.0–10.5)
nRBC: 0 % (ref 0.0–0.2)

## 2023-05-29 LAB — HEPATITIS PANEL, ACUTE
HCV Ab: REACTIVE — AB
Hep A IgM: NONREACTIVE
Hep B C IgM: NONREACTIVE
Hepatitis B Surface Ag: NONREACTIVE

## 2023-05-29 LAB — I-STAT CHEM 8, ED
BUN: 8 mg/dL (ref 6–20)
Calcium, Ion: 1.07 mmol/L — ABNORMAL LOW (ref 1.15–1.40)
Chloride: 106 mmol/L (ref 98–111)
Creatinine, Ser: 0.7 mg/dL (ref 0.61–1.24)
Glucose, Bld: 92 mg/dL (ref 70–99)
HCT: 34 % — ABNORMAL LOW (ref 39.0–52.0)
Hemoglobin: 11.6 g/dL — ABNORMAL LOW (ref 13.0–17.0)
Potassium: 3.9 mmol/L (ref 3.5–5.1)
Sodium: 141 mmol/L (ref 135–145)
TCO2: 24 mmol/L (ref 22–32)

## 2023-05-29 LAB — AMMONIA: Ammonia: 51 umol/L — ABNORMAL HIGH (ref 9–35)

## 2023-05-29 LAB — COMPREHENSIVE METABOLIC PANEL
ALT: 103 U/L — ABNORMAL HIGH (ref 0–44)
AST: 255 U/L — ABNORMAL HIGH (ref 15–41)
Albumin: 2 g/dL — ABNORMAL LOW (ref 3.5–5.0)
Alkaline Phosphatase: 166 U/L — ABNORMAL HIGH (ref 38–126)
Anion gap: 6 (ref 5–15)
BUN: 7 mg/dL (ref 6–20)
CO2: 23 mmol/L (ref 22–32)
Calcium: 8.2 mg/dL — ABNORMAL LOW (ref 8.9–10.3)
Chloride: 108 mmol/L (ref 98–111)
Creatinine, Ser: 0.77 mg/dL (ref 0.61–1.24)
GFR, Estimated: >60 mL/min (ref >60)
Glucose, Bld: 109 mg/dL — ABNORMAL HIGH (ref 70–99)
Potassium: 3.8 mmol/L (ref 3.5–5.1)
Sodium: 137 mmol/L (ref 135–145)
Total Bilirubin: 3.2 mg/dL — ABNORMAL HIGH (ref 0.0–1.2)
Total Protein: 6.5 g/dL (ref 6.5–8.1)

## 2023-05-29 LAB — BILIRUBIN, FRACTIONATED(TOT/DIR/INDIR)
Bilirubin, Direct: 1.2 mg/dL — ABNORMAL HIGH (ref 0.0–0.2)
Indirect Bilirubin: 2.2 mg/dL — ABNORMAL HIGH (ref 0.3–0.9)
Total Bilirubin: 3.4 mg/dL — ABNORMAL HIGH (ref 0.0–1.2)

## 2023-05-29 LAB — PROTIME-INR
INR: 1.9 — ABNORMAL HIGH (ref 0.8–1.2)
Prothrombin Time: 21.5 s — ABNORMAL HIGH (ref 11.4–15.2)

## 2023-05-29 LAB — TROPONIN I (HIGH SENSITIVITY)
Troponin I (High Sensitivity): 10 ng/L (ref <18)
Troponin I (High Sensitivity): 11 ng/L (ref <18)

## 2023-05-29 LAB — BRAIN NATRIURETIC PEPTIDE: B Natriuretic Peptide: 154.1 pg/mL — ABNORMAL HIGH (ref 0.0–100.0)

## 2023-05-29 LAB — LIPASE, BLOOD: Lipase: 45 U/L (ref 11–51)

## 2023-05-29 LAB — LACTIC ACID, PLASMA
Lactic Acid, Venous: 2.6 mmol/L (ref 0.5–1.9)
Lactic Acid, Venous: 1.8 mmol/L (ref 0.5–1.9)

## 2023-05-29 LAB — HIV ANTIBODY (ROUTINE TESTING W REFLEX): HIV Screen 4th Generation wRfx: NONREACTIVE

## 2023-05-29 LAB — ETHANOL: Alcohol, Ethyl (B): <10 mg/dL (ref <10)

## 2023-05-29 MED ORDER — SODIUM CHLORIDE 0.9 % IV BOLUS
1000.0000 mL | Freq: Once | INTRAVENOUS | Status: AC
Start: 2023-05-29 — End: 2023-05-29
  Administered 2023-05-29: 1000 mL via INTRAVENOUS

## 2023-05-29 MED ORDER — HYDROMORPHONE HCL 1 MG/ML IJ SOLN
0.5000 mg | INTRAMUSCULAR | Status: DC | PRN
  Administered 2023-05-29 – 2023-05-30 (×3): 1 mg via INTRAVENOUS
  Administered 2023-05-30: 0.5 mg via INTRAVENOUS
  Filled 2023-05-29 (×4): qty 1

## 2023-05-29 MED ORDER — SODIUM CHLORIDE 0.9 % IV SOLN: INTRAVENOUS | Status: DC

## 2023-05-29 MED ORDER — HYDROCODONE-ACETAMINOPHEN 5-325 MG PO TABS: 1.0000 | ORAL_TABLET | Freq: Once | ORAL | Status: DC

## 2023-05-29 MED ORDER — HYDROMORPHONE HCL 1 MG/ML IJ SOLN
1.0000 mg | Freq: Once | INTRAMUSCULAR | Status: AC
  Administered 2023-05-29: 1 mg via INTRAVENOUS
  Filled 2023-05-29: qty 1

## 2023-05-29 MED ORDER — OXYCODONE-ACETAMINOPHEN 5-325 MG PO TABS
2.0000 | ORAL_TABLET | Freq: Once | ORAL | Status: DC
  Filled 2023-05-29: qty 2

## 2023-05-29 MED ORDER — ONDANSETRON 4 MG PO TBDP
4.0000 mg | ORAL_TABLET | Freq: Once | ORAL | Status: AC
  Administered 2023-05-29: 4 mg via ORAL
  Filled 2023-05-29: qty 1

## 2023-05-29 MED ORDER — ONDANSETRON HCL 4 MG/2ML IJ SOLN: 4.0000 mg | Freq: Four times a day (QID) | INTRAMUSCULAR | Status: DC | PRN

## 2023-05-29 MED ORDER — ONDANSETRON HCL 4 MG PO TABS
4.0000 mg | ORAL_TABLET | Freq: Four times a day (QID) | ORAL | Status: DC | PRN
  Filled 2023-05-29: qty 1

## 2023-05-29 MED ORDER — ACETAMINOPHEN 500 MG PO TABS
500.0000 mg | ORAL_TABLET | Freq: Four times a day (QID) | ORAL | Status: DC
  Administered 2023-05-29 – 2023-06-01 (×10): 500 mg via ORAL
  Filled 2023-05-29 (×11): qty 1

## 2023-05-29 MED ORDER — FENTANYL CITRATE PF 50 MCG/ML IJ SOSY: 50.0000 ug | PREFILLED_SYRINGE | Freq: Once | INTRAMUSCULAR | Status: DC

## 2023-05-29 MED ORDER — ADULT MULTIVITAMIN W/MINERALS CH
1.0000 | ORAL_TABLET | Freq: Every day | ORAL | Status: DC
  Administered 2023-05-29 – 2023-06-02 (×4): 1 via ORAL
  Filled 2023-05-29 (×4): qty 1

## 2023-05-29 MED ORDER — IOHEXOL 350 MG/ML SOLN
100.0000 mL | Freq: Once | INTRAVENOUS | Status: AC | PRN
  Administered 2023-05-29: 100 mL via INTRAVENOUS

## 2023-05-29 MED ORDER — PIPERACILLIN-TAZOBACTAM 3.375 G IVPB
3.3750 g | Freq: Three times a day (TID) | INTRAVENOUS | Status: DC
  Administered 2023-05-29 – 2023-05-30 (×2): 3.375 g via INTRAVENOUS
  Filled 2023-05-29 (×2): qty 50

## 2023-05-29 MED ORDER — ACETAMINOPHEN 650 MG RE SUPP
325.0000 mg | Freq: Four times a day (QID) | RECTAL | Status: DC
  Administered 2023-06-02: 325 mg via RECTAL

## 2023-05-29 NOTE — Consult Note (Signed)
Reason for Consult:abdominal pain, gallstones Referring Physician: Thayer Ohm Tegeler  Alexander Mckenzie is an 43 y.o. male.  HPI: 43yo M with PMHx Hep C, past drug abuse, and HTN reports he had sudden onset of epigastric and right-sided abdominal pain earlier today.  He had a normal bowel movement without any blood or diarrhea.  He denies nausea or vomiting.  He reports he lives at a halfway house since being released from prison last month.  He denies current drug use and reports they frequently test him where he lives.  Additionally, he has concerns that he has cirrhosis and reports that he underwent some evaluation for that recently due to leg swelling and other complaints.  CT of the abdomen pelvis in the emergency department shows colitis, gallstones, and some right upper quadrant ascites.  I was consulted for surgical management.  Additionally, his liver function tests are abnormal.  Past Medical History:  Diagnosis Date   Hepatitis C    Hypertension    SVT (supraventricular tachycardia) (HCC)     History reviewed. No pertinent surgical history.  History reviewed. No pertinent family history.  Social History:  reports that he has been smoking cigarettes. He has never used smokeless tobacco. He reports current drug use. Drug: Marijuana. He reports that he does not drink alcohol.  Allergies:  Allergies  Allergen Reactions   Keflex [Cephalexin] Rash    Medications: I have reviewed the patient's current medications.  Results for orders placed or performed during the hospital encounter of 05/29/23 (from the past 48 hours)  CBC     Status: Abnormal   Collection Time: 05/29/23  3:35 PM  Result Value Ref Range   WBC 2.7 (L) 4.0 - 10.5 K/uL   RBC 3.80 (L) 4.22 - 5.81 MIL/uL   Hemoglobin 12.4 (L) 13.0 - 17.0 g/dL   HCT 62.1 (L) 30.8 - 65.7 %   MCV 96.1 80.0 - 100.0 fL   MCH 32.6 26.0 - 34.0 pg   MCHC 34.0 30.0 - 36.0 g/dL   RDW 84.6 96.2 - 95.2 %   Platelets 48 (L) 150 - 400 K/uL     Comment: SPECIMEN CHECKED FOR CLOTS Immature Platelet Fraction may be clinically indicated, consider ordering this additional test [id number] REPEATED TO VERIFY PLATELET COUNT CONFIRMED BY SMEAR    nRBC 0.0 0.0 - 0.2 %    Comment: Performed at [facility], [address] [place], [place] [address]  Comprehensive metabolic panel     Status: Abnormal   Collection Time: 05/29/23  3:35 PM  Result Value Ref Range   Sodium 137 135 - 145 mmol/L   Potassium 3.8 3.5 - 5.1 mmol/L   Chloride 108 98 - 111 mmol/L   CO2 23 22 - 32 mmol/L   Glucose, Bld 109 (H) 70 - 99 mg/dL    Comment: Glucose reference range applies only to samples taken after fasting for at least 8 hours.   BUN 7 6 - 20 mg/dL   Creatinine, Ser 5.36 0.61 - 1.24 mg/dL   Calcium 8.2 (L) 8.9 - 10.3 mg/dL   Total Protein 6.5 6.5 - 8.1 g/dL   Albumin 2.0 (L) 3.5 - 5.0 g/dL   AST 644 (H) 15 - 41 U/L   ALT 103 (H) 0 - 44 U/L   Alkaline Phosphatase 166 (H) 38 - 126 U/L   Total Bilirubin 3.2 (H) 0.0 - 1.2 mg/dL   GFR, Estimated >03 >47 mL/min    Comment: (NOTE) Calculated using the CKD-EPI  Creatinine Equation (2021)    Anion gap 6 5 - 15    Comment: Performed at [facility], [address] [place], [place] [address]  Lipase, blood     Status: None   Collection Time: 05/29/23  3:35 PM  Result Value Ref Range   Lipase 45 11 - 51 U/L    Comment: Performed at [facility], [address], [place], [place] [address]  Lactic acid, plasma     Status: Abnormal   Collection Time: 05/29/23  3:35 PM  Result Value Ref Range   Lactic Acid, Venous 2.6 (HH) 0.5 - 1.9 mmol/L    Comment: CRITICAL RESULT CALLED TO, READ BACK BY AND VERIFIED WITH Tommy Rainwater, RN @ [phone] 05/29/23 BY [facility] Performed at [facility], [address], [place], [place] [address]   I-stat chem 8, ED (not at [facility], [facility] or [facility])     Status: Abnormal   Collection Time: 05/29/23  4:05 PM  Result Value Ref Range   Sodium 141 135 - 145 mmol/L    Potassium 3.9 3.5 - 5.1 mmol/L   Chloride 106 98 - 111 mmol/L   BUN 8 6 - 20 mg/dL   Creatinine, Ser 8.65 0.61 - 1.24 mg/dL   Glucose, Bld 92 70 - 99 mg/dL    Comment: Glucose reference range applies only to samples taken after fasting for at least 8 hours.   Calcium, Ion 1.07 (L) 1.15 - 1.40 mmol/L   TCO2 24 22 - 32 mmol/L   Hemoglobin 11.6 (L) 13.0 - 17.0 g/dL   HCT 78.4 (L) 69.6 - 29.5 %    CT Angio Chest/Abd/Pel for Dissection W and/or Wo Contrast Result Date: 05/29/2023 CLINICAL DATA:  Severe tearing pain in the abdomen extending into the chest. Clinical concern for acute aortic dissection. EXAM: CT ANGIOGRAPHY CHEST, ABDOMEN AND PELVIS TECHNIQUE: Non-contrast CT of the chest was initially obtained. Multidetector CT imaging through the chest, abdomen and pelvis was performed using the standard protocol during bolus administration of intravenous contrast. Multiplanar reconstructed images and MIPs were obtained and reviewed to evaluate the vascular anatomy. RADIATION DOSE REDUCTION: This exam was performed according to the departmental dose-optimization program which includes automated exposure control, adjustment of the mA and/or kV according to patient size and/or use of iterative reconstruction technique. CONTRAST:  OMNIPAQUE IOHEXOL 350 MG/ML SOLN COMPARISON:  None Available. FINDINGS: CTA CHEST FINDINGS Cardiovascular: Atheromatous coronary artery calcifications. Normal-sized heart. No pericardial effusion. Normal-appearing thoracic aorta without aneurysm or dissection. Mediastinum/Nodes: No enlarged mediastinal, hilar, or axillary lymph nodes. Thyroid gland, trachea, and esophagus demonstrate no significant findings. Lungs/Pleura: Lungs are clear. No pleural effusion or pneumothorax. Musculoskeletal: Minimal thoracic spine degenerative changes. Review of the MIP images confirms the above findings. CTA ABDOMEN AND PELVIS FINDINGS VASCULAR Aorta: Normal, without aneurysmal dissection.  Celiac: Common trunk with the superior mesenteric artery. Patent without evidence of aneurysm, dissection, vasculitis or significant stenosis. SMA: Common trunk with the superior mesenteric artery. Patent without evidence of aneurysm, dissection, vasculitis or significant stenosis. Renals: 2 renal arteries on each side with all 4 renal arteries patent without evidence of aneurysm, dissection, vasculitis, fibromuscular dysplasia or significant stenosis. IMA: Patent without evidence of aneurysm, dissection, vasculitis or significant stenosis. Inflow: Mild bilateral iliac artery calcifications without aneurysm or dissection. Veins: No obvious venous abnormality within the limitations of this arterial phase study. Review of the MIP images confirms the above findings. NON-VASCULAR Hepatobiliary: Multiple gallstones in the gallbladder measuring up  to 1.8 cm in maximum diameter each. Extensive pericholecystic fluid. Small right lobe of the liver with enlarged lateral segment left lobe and caudate lobe. Pancreas: Unremarkable. No pancreatic ductal dilatation or surrounding inflammatory changes. Spleen: Enlarged, measuring 16.1 cm in length. Adrenals/Urinary Tract: Adrenal glands are unremarkable. Kidneys are normal, without renal calculi, focal lesion, or hydronephrosis. Bladder is unremarkable. Stomach/Bowel: Moderate to marked diffuse low-density wall thickening involving the colon, most pronounced involving the ascending colon, hepatic flexure and transverse colon. Unremarkable stomach and small bowel. Normal-appearing appendix in the right lower abdomen. Lymphatic: Multiple mildly enlarged upper abdominal lymph nodes. These are primarily mesenteric nodes. There are also enlarged aortocaval and gastrohepatic ligament nodes. The largest node is a gastrohepatic ligament node with a short axis diameter of 1.5 cm on image number 151/7. There is also an enlarged right inguinal node with a short axis diameter of 1.4 cm on  image number 355/7. Reproductive: Prostate is unremarkable. Other: Small to moderate amount of free peritoneal fluid in the abdomen and pelvis. No free peritoneal air. Possible undescended testis in the inferior right inguinal canal. A moderate sized inferior inguinal hernia containing low to medium density fluid is less likely. Musculoskeletal: Unremarkable bones. Review of the MIP images confirms the above findings. IMPRESSION: 1. No aortic aneurysm or dissection. 2. Moderate to marked changes of acute colitis involving the majority of the colon, most pronounced involving the ascending colon, hepatic flexure and transverse colon. This could be infectious or inflammatory in nature. 3. Cholelithiasis with extensive pericholecystic fluid. The fluid is most likely due to the diffuse colitis. Acute cholecystitis is less likely but not excluded. 4. Small to moderate amount of free peritoneal fluid in the abdomen and pelvis. 5. 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 6. Multiple mildly enlarged upper abdominal lymph nodes, primarily mesenteric and gastrohepatic ligament nodes as well as a right inguinal node. These are nonspecific and most likely reactive. 7. Possible undescended testis in the inferior right inguinal canal. A moderate sized inferior inguinal hernia containing low to medium density fluid is less likely. 8. Atheromatous coronary artery calcifications. These results were called by telephone at the time of interpretation on 05/29/2023 at 4:29 pm to provider [facility] , who verbally acknowledged these results. Electronically Signed   By: Beckie Salts M.D.   On: 05/29/2023 16:51    Review of Systems  Constitutional:  Negative for appetite change.  HENT: Negative.    Eyes: Negative.   Respiratory: Negative.    Cardiovascular: Negative.   Gastrointestinal:  Positive for abdominal pain. Negative for diarrhea, nausea and vomiting.  Endocrine: Negative.   Genitourinary: Negative.    Musculoskeletal: Negative.   Skin: Negative.   Allergic/Immunologic: Negative.   Neurological: Negative.   Hematological: Negative.   Psychiatric/Behavioral: Negative.     Blood pressure (!) 159/90, pulse 97, temperature 98.5 F (36.9 C), temperature source Oral, resp. rate 20, SpO2 100%. Physical Exam HENT:     Head: Normocephalic.  Eyes:     General: Scleral icterus present.  Cardiovascular:     Rate and Rhythm: Normal rate and regular rhythm.  Pulmonary:     Effort: Pulmonary effort is normal.     Breath sounds: Normal breath sounds.  Abdominal:     General: There is no distension.     Palpations: Abdomen is soft.     Tenderness: There is abdominal tenderness in the right upper quadrant, right lower quadrant and epigastric area. There is no guarding or rebound.  Hernia: No hernia is present.  Skin:    General: Skin is warm.     Comments: Multiple tattoos  Neurological:     Mental Status: He is alert and oriented to person, place, and time.  Psychiatric:        Mood and Affect: Mood normal.     Assessment/Plan: Abdominal pain Gallstones Transaminitis and hyperbilirubinemia Colitis Ascites Leukopenia and thrombocytopenia Concern for cirrhosis  Agree with medical admission.  Recommend bowel rest and IV antibiotics for his colitis.  This will cover his gallbladder if that is an acute issue.  Recommend repeat LFTs in the morning.  We may consider HIDA scan tomorrow depending on his exam and liver function tests.  PT/INR pending to help with evaluation of his liver.  No emergent surgery needed but we will follow.    Liz Malady 05/29/2023, 5:12 PM

## 2023-05-29 NOTE — ED Notes (Addendum)
Patient care handoff to Claris Che.  RN

## 2023-05-29 NOTE — ED Notes (Signed)
IV infiltrated upon return from CT.

## 2023-05-29 NOTE — Assessment & Plan Note (Signed)
Bilateral 2-3+ lower extremity edema without tenderness.  Has been ongoing for a few weeks.  Possibly related to underlying cirrhosis and low oncotic pressure. Albumin 2.0. -CMP -BNP

## 2023-05-29 NOTE — ED Notes (Signed)
Gen surg at [facility].

## 2023-05-29 NOTE — ED Notes (Signed)
Assumed pt care.

## 2023-05-29 NOTE — Assessment & Plan Note (Signed)
On CT showed cholelithiasis without cholecystitis and acute colitis involving majority of colon.  Severe abdominal pain on examination.  -Admit to FMTS, progressive, attending Dr. McDiarmid -General surgery following, appreciate recs -Consider GI involvement if not improving -Zosyn qh8 -Bowel rest (N.p.o. except sips of water and ice chips) -If fevers, consider  cultures -EKG, troponin -Pain regimen: Tylenol 500 scheduled every 6, Dilaudid 0.5 to 1 mg every 3 hours as needed -AM CBC, CMP

## 2023-05-29 NOTE — ED Notes (Signed)
Admitting MD in to see, at BS.  

## 2023-05-29 NOTE — ED Triage Notes (Addendum)
Pt reports sudden onset epigastric abdominal pain and abdominal tenderness that started at 1300 today.

## 2023-05-29 NOTE — Assessment & Plan Note (Signed)
Cirrhotic changes with splenomegaly seen on CT.  AST/ALT 255/103. Bili 3.2. -AM CMP -PT/INR -Fractionated bili -Hepatitis panel -Ethanol level -Consider right upper quadrant ultrasound if HIDA not ordered -F/u ammonia

## 2023-05-29 NOTE — ED Provider Triage Note (Signed)
Emergency Medicine Provider Triage Evaluation Note  Sugar Lulgjuraj , a 43 y.o. male  was evaluated in triage.  Pt complains of severe epigastric abdominal pain sudden onset at 1:00.  No history of same.  He lives in a halfway house does not use drugs or alcohol he is tested daily.  Mother is at bedside states that he had sudden onset of pain when she was taking him to a job interview.  No nausea..  Review of Systems  Positive: Severe abdominal pain Negative:   Physical Exam  BP 111/60 (BP Location: Right Arm)   Pulse 94   Temp 98 F (36.7 C)   Resp (!) 22   SpO2 94%  Gen:   Awake, no distress   Resp:  Normal effort  MSK:   Moves extremities without difficulty  Other:  Rigid abdomen  Medical Decision Making  Medically screening exam initiated at 2:48 PM.  Appropriate orders placed.  Reyhan Weyker was informed that the remainder of the evaluation will be completed by another provider, this initial triage assessment does not replace that evaluation, and the importance of remaining in the ED until their evaluation is complete.  Concern for potential perforated viscus/perforated ulcer.  Low suspicion for dissection.  Patient moved immediately to a bed.   Arthor Captain, PA-C 05/29/23 [phone]

## 2023-05-29 NOTE — ED Notes (Signed)
Full rainbow sent to lab. Lab notified.

## 2023-05-29 NOTE — Plan of Care (Signed)
FMTS Interim Progress Note  S: Patient reports pain has somewhat improved and is tolerable with laying flat.   O: BP 137/75   Pulse 95   Temp 98.5 F (36.9 C) (Oral)   Resp 16   Ht 6\' 4"  (1.93 m)   Wt 124.4 kg   SpO2 100%   BMI 33.38 kg/m   Gen: NAD Card: RRR Resp: Normal wob  Abd: soft, nondistended, guarding and diffusely tender  A/P: Bowel rest, can take a few sips of fluid to help with thirst and will order fluids. Gentle fluids @40mL /hr x8 hours  IV abx Gen surg on board  HIDA in AM    Alfredo Martinez, MD 05/29/2023, 9:03 PM PGY-3, [facility] pager [phone]

## 2023-05-29 NOTE — Hospital Course (Addendum)
Alexander Mckenzie is a 43 y.o.male with a history of HTN, Hep C, IVDU who was admitted to the [facility] Medicine Teaching Service at [facility] for abdominal pain. His hospital course is detailed below:  Abdominal Pain CT with cholelithiasis, no cholecystitis, and acute colitis involving majority of colon. Pt was started on Zosyn q8h and bowel rest. General surgery and GI were consulted, no surgical intervention recommended at the time, not felt to be acute cholecystitis. Overall uncertain etiology, UDS positive for amphetamines. Patient tolerated CLD well. Patient tolerating regular diet, had a BM, with pain well controlled by time of discharge. Patient discharged with Augmentin for remaining abx course.   Transaminitis Cirrhotic changes with splenomegaly seen on CT.  AST/ALT downtrended 127/58 > 122/60 > 118/56. Hep C Ab+, plan for outpatient f/u w/ ID.   BLE swelling Bilateral upper and lower extremity edema without tenderness. Possibly related to underlying cirrhosis and low oncotic pressure. RUE DVT US negative.   Pancytopenia  DIC DIC lab panel positive but possibly false positive d/t liver cirrhosis. No signs of active bleeding or thrombotic events. No inpatient intervention per Heme. Suspected to be in setting of splenic sequestration with underlying cirrhosis/liver dysfunction. Pharmacologic VTE prophylaxis held throughout admission.  Prothrombin and INR downtrended during admission. Patient received 1 unit of platelets with improvement. RUQ u/s ruled out hepatic venous thrombosis.   Other chronic conditions were medically managed with home medications and formulary alternatives as necessary  PCP Follow-up Recommendations: [facility] GI and ID recs

## 2023-05-29 NOTE — ED Provider Notes (Signed)
[facility] AT [facility] Provider Note   CSN: [id number] Arrival date & time: 05/29/23  1354     History  Chief Complaint  Patient presents with   Abdominal Pain    Alexander Mckenzie is a 43 y.o. male.  The history is provided by the patient, medical records and a parent. No language interpreter was used.  Abdominal Pain Pain location:  Generalized Pain quality: aching, sharp and stabbing   Pain radiates to:  Chest Pain severity:  Severe Onset quality:  Sudden Duration:  2 hours Timing:  Constant Progression:  Unchanged Chronicity:  New Context: not trauma   Relieved by:  Nothing Worsened by:  Palpation Ineffective treatments:  None tried Associated symptoms: chest pain, chills and nausea   Associated symptoms: no constipation, no cough, no diarrhea, no dysuria, no fatigue, no fever, no flatus, no shortness of breath and no vomiting        Home Medications Prior to Admission medications   Medication Sig Start Date End Date Taking? Authorizing Provider  famotidine (PEPCID) 40 MG tablet Take 1 tablet (40 mg total) by mouth daily. 07/14/17   Benjiman Core, MD  ondansetron (ZOFRAN-ODT) 4 MG disintegrating tablet Take 1 tablet (4 mg total) by mouth every 8 (eight) hours as needed for nausea or vomiting. 07/14/17   Benjiman Core, MD      Allergies    Keflex [cephalexin]    Review of Systems   Review of Systems  Constitutional:  Positive for chills and diaphoresis. Negative for fatigue and fever.  HENT:  Negative for congestion.   Respiratory:  Negative for cough, chest tightness, shortness of breath and wheezing.   Cardiovascular:  Positive for chest pain. Negative for palpitations and leg swelling.  Gastrointestinal:  Positive for abdominal pain and nausea. Negative for constipation, diarrhea, flatus and vomiting.  Genitourinary:  Negative for dysuria and flank pain.  Musculoskeletal:  Negative for back pain and neck pain.  Skin:   Negative for rash and wound.  Neurological:  Negative for headaches.  Psychiatric/Behavioral:  Positive for agitation. Negative for confusion.   All other systems reviewed and are negative.   Physical Exam Updated Vital Signs BP (!) 151/95   Pulse 97   Temp 98.5 F (36.9 C) (Oral)   Resp (!) 22   SpO2 100%  Physical Exam Vitals and nursing note reviewed.  Constitutional:      General: He is in acute distress.     Appearance: He is well-developed. He is ill-appearing and diaphoretic.  HENT:     Head: Normocephalic and atraumatic.     Mouth/Throat:     Mouth: Mucous membranes are moist.  Eyes:     Extraocular Movements: Extraocular movements intact.     Conjunctiva/sclera: Conjunctivae normal.  Cardiovascular:     Rate and Rhythm: Normal rate and regular rhythm.     Heart sounds: No murmur heard. Pulmonary:     Effort: Pulmonary effort is normal. No respiratory distress.     Breath sounds: Normal breath sounds. No wheezing, rhonchi or rales.  Chest:     Chest wall: No tenderness.  Abdominal:     General: Abdomen is flat. Bowel sounds are normal. There is no distension.     Palpations: Abdomen is soft.     Tenderness: There is abdominal tenderness.  Genitourinary:    Comments: Denied groin symptoms initially Musculoskeletal:        General: No swelling.     Cervical back: Neck supple.  Skin:    General: Skin is warm.     Capillary Refill: Capillary refill takes less than 2 seconds.  Neurological:     Mental Status: He is alert.  Psychiatric:        Mood and Affect: Mood normal.     ED Results / Procedures / Treatments   Labs (all labs ordered are listed, but only abnormal results are displayed) Labs Reviewed  CBC - Abnormal; Notable for the following components:      Result Value   WBC 2.7 (*)    RBC 3.80 (*)    Hemoglobin 12.4 (*)    HCT 36.5 (*)    Platelets 48 (*)    All other components within normal limits  COMPREHENSIVE METABOLIC PANEL - Abnormal;  Notable for the following components:   Glucose, Bld 109 (*)    Calcium 8.2 (*)    Albumin 2.0 (*)    AST 255 (*)    ALT 103 (*)    Alkaline Phosphatase 166 (*)    Total Bilirubin 3.2 (*)    All other components within normal limits  LACTIC ACID, PLASMA - Abnormal; Notable for the following components:   Lactic Acid, Venous 2.6 (*)    All other components within normal limits  I-STAT CHEM 8, ED - Abnormal; Notable for the following components:   Calcium, Ion 1.07 (*)    Hemoglobin 11.6 (*)    HCT 34.0 (*)    All other components within normal limits  LIPASE, BLOOD  URINALYSIS, ROUTINE W REFLEX MICROSCOPIC  LACTIC ACID, PLASMA  PROTIME-INR  AMMONIA  HEPATITIS PANEL, ACUTE  ETHANOL  RAPID URINE DRUG SCREEN, HOSP PERFORMED    EKG None  Radiology CT Angio Chest/Abd/Pel for Dissection W and/or Wo Contrast Result Date: 05/29/2023 CLINICAL DATA:  Severe tearing pain in the abdomen extending into the chest. Clinical concern for acute aortic dissection. EXAM: CT ANGIOGRAPHY CHEST, ABDOMEN AND PELVIS TECHNIQUE: Non-contrast CT of the chest was initially obtained. Multidetector CT imaging through the chest, abdomen and pelvis was performed using the standard protocol during bolus administration of intravenous contrast. Multiplanar reconstructed images and MIPs were obtained and reviewed to evaluate the vascular anatomy. RADIATION DOSE REDUCTION: This exam was performed according to the departmental dose-optimization program which includes automated exposure control, adjustment of the mA and/or kV according to patient size and/or use of iterative reconstruction technique. CONTRAST:  OMNIPAQUE IOHEXOL 350 MG/ML SOLN COMPARISON:  None Available. FINDINGS: CTA CHEST FINDINGS Cardiovascular: Atheromatous coronary artery calcifications. Normal-sized heart. No pericardial effusion. Normal-appearing thoracic aorta without aneurysm or dissection. Mediastinum/Nodes: No enlarged mediastinal, hilar,  or axillary lymph nodes. Thyroid gland, trachea, and esophagus demonstrate no significant findings. Lungs/Pleura: Lungs are clear. No pleural effusion or pneumothorax. Musculoskeletal: Minimal thoracic spine degenerative changes. Review of the MIP images confirms the above findings. CTA ABDOMEN AND PELVIS FINDINGS VASCULAR Aorta: Normal, without aneurysmal dissection. Celiac: Common trunk with the superior mesenteric artery. Patent without evidence of aneurysm, dissection, vasculitis or significant stenosis. SMA: Common trunk with the superior mesenteric artery. Patent without evidence of aneurysm, dissection, vasculitis or significant stenosis. Renals: 2 renal arteries on each side with all 4 renal arteries patent without evidence of aneurysm, dissection, vasculitis, fibromuscular dysplasia or significant stenosis. IMA: Patent without evidence of aneurysm, dissection, vasculitis or significant stenosis. Inflow: Mild bilateral iliac artery calcifications without aneurysm or dissection. Veins: No obvious venous abnormality within the limitations of this arterial phase study. Review of the MIP images confirms the above findings.  NON-VASCULAR Hepatobiliary: Multiple gallstones in the gallbladder measuring up to 1.8 cm in maximum diameter each. Extensive pericholecystic fluid. Small right lobe of the liver with enlarged lateral segment left lobe and caudate lobe. Pancreas: Unremarkable. No pancreatic ductal dilatation or surrounding inflammatory changes. Spleen: Enlarged, measuring 16.1 cm in length. Adrenals/Urinary Tract: Adrenal glands are unremarkable. Kidneys are normal, without renal calculi, focal lesion, or hydronephrosis. Bladder is unremarkable. Stomach/Bowel: Moderate to marked diffuse low-density wall thickening involving the colon, most pronounced involving the ascending colon, hepatic flexure and transverse colon. Unremarkable stomach and small bowel. Normal-appearing appendix in the right lower abdomen.  Lymphatic: Multiple mildly enlarged upper abdominal lymph nodes. These are primarily mesenteric nodes. There are also enlarged aortocaval and gastrohepatic ligament nodes. The largest node is a gastrohepatic ligament node with a short axis diameter of 1.5 cm on image number 151/7. There is also an enlarged right inguinal node with a short axis diameter of 1.4 cm on image number 355/7. Reproductive: Prostate is unremarkable. Other: Small to moderate amount of free peritoneal fluid in the abdomen and pelvis. No free peritoneal air. Possible undescended testis in the inferior right inguinal canal. A moderate sized inferior inguinal hernia containing low to medium density fluid is less likely. Musculoskeletal: Unremarkable bones. Review of the MIP images confirms the above findings. IMPRESSION: 1. No aortic aneurysm or dissection. 2. Moderate to marked changes of acute colitis involving the majority of the colon, most pronounced involving the ascending colon, hepatic flexure and transverse colon. This could be infectious or inflammatory in nature. 3. Cholelithiasis with extensive pericholecystic fluid. The fluid is most likely due to the diffuse colitis. Acute cholecystitis is less likely but not excluded. 4. Small to moderate amount of free peritoneal fluid in the abdomen and pelvis. 5. 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 6. Multiple mildly enlarged upper abdominal lymph nodes, primarily mesenteric and gastrohepatic ligament nodes as well as a right inguinal node. These are nonspecific and most likely reactive. 7. Possible undescended testis in the inferior right inguinal canal. A moderate sized inferior inguinal hernia containing low to medium density fluid is less likely. 8. Atheromatous coronary artery calcifications. These results were called by telephone at the time of interpretation on 05/29/2023 at 4:29 pm to provider [facility] , who verbally acknowledged these results. Electronically  Signed   By: Beckie Salts M.D.   On: 05/29/2023 16:51    Procedures Procedures    CRITICAL CARE Performed by: Canary Brim Mackinzie Vuncannon Total critical care time: 45 minutes Critical care time was exclusive of separately billable procedures and treating other patients. Critical care was necessary to treat or prevent imminent or life-threatening deterioration. Critical care was time spent personally by me on the following activities: development of treatment plan with patient and/or surrogate as well as nursing, discussions with consultants, evaluation of patient's response to treatment, examination of patient, obtaining history from patient or surrogate, ordering and performing treatments and interventions, ordering and review of laboratory studies, ordering and review of radiographic studies, pulse oximetry and re-evaluation of patient's condition.   Medications Ordered in ED Medications  HYDROmorphone (DILAUDID) injection 1 mg (has no administration in time range)  ondansetron (ZOFRAN-ODT) disintegrating tablet 4 mg (4 mg Oral Given 05/29/23 1439)  HYDROmorphone (DILAUDID) injection 1 mg (1 mg Intravenous Given 05/29/23 1548)  sodium chloride 0.9 % bolus 1,000 mL (0 mLs Intravenous Stopped 05/29/23 1647)  iohexol (OMNIPAQUE) 350 MG/ML injection 100 mL (100 mLs Intravenous Contrast Given 05/29/23 1623)  ED Course/ Medical Decision Making/ A&P                                 Medical Decision Making Amount and/or Complexity of Data Reviewed Labs: ordered. Radiology: ordered.  Risk Prescription drug management. Decision regarding hospitalization.    Alexander Mckenzie is a 43 y.o. male with a past medical history significant for hypertension, hepatitis C, and previous SVT who presents with sudden onset severe abdominal pain and chest pain.  According to the patient, he was incarcerated for about 5 years and recently got out of prison.  He has been managed by primary doctor for some bilateral  leg discomfort and swelling that has been unchanged.  It is not worse today.  He is denying significant leg pain today but reports that about 1 PM he had sudden onset of severe pain in his abdomen going into his chest.  He reports it is knifelike and sharp and goes to his chest.  Does not go to the back.  He had some nausea earlier but no vomiting.  Reports had a bowel move this morning that was normal and was not bloody to his knowledge.  He says he is not passing gas now and has not had a bowel movement since it began.  He is writhing around clutching his abdomen.  He is never had this pain before.  This is new.  Denies groin or testicle pains.  Denies trauma.  Denies any fevers but is now having chills.  Denies any drug use otherwise and denies any IV drug use recently.  On exam, lungs clear.  Chest was nontender but he is having pain going to his chest.  Abdomen is diffusely tender and his abdomen is rigid.  I did appreciate bowel sound on my evaluation.  Back and flanks nontender.  Patient cleared uncomfortable and diaphoretic.  Patient rolling around exam bed with significant pain.  He did have pulses in both lower extremities and legs were nontender and nonedematous on my initial exam.  Patient seen in triage and there is concern for acute abdomen.  He will get acute abdominal series with x-rays first.  He still is having the severe pain and has not had the x-rays.  Will also order the dissection study of his chest/abdomen/pelvis.  Given his good pulses have low suspicion for this however with the sharp tearing pain that is so uncomfortable I am concerned about ruling it out.  Also concerned about obstruction or bowel perforation of some kind.  Will get screening labs and will give him pain medicine.  He got nausea medicine in triage that seem to help.  Will give some fluids as well.  Anticipate reassessment after workup to determine disposition.  4:32 PM I looked at the images while patient was  still in the CT scanner and am concerned about significant findings.  I am concerned about acute cholecystitis versus other intra-abdominal pathology.  I called radiology and spoke to the radiologist who agrees that this appears to show a severe acute cholecystitis with possible rupture.  Also possible colitis but no evidence of free air.  Will call general surgery and will talk to pharmacy about best antibiotic given his cephalosporin allergy.  5:05 PM General Surgery will see in consultation but agree with medicine admission.  Pharmacy will help order the correct antibiotics.  Patient will be admitted.        Final Clinical Impression(s) /  ED Diagnoses Final diagnoses:  Generalized abdominal pain  Acute cholecystitis  Thrombocytopenia (HCC)  LFT elevation    Clinical Impression: 1. Generalized abdominal pain   2. Acute cholecystitis   3. Thrombocytopenia (HCC)   4. LFT elevation     Disposition: Admit  This note was prepared with assistance of Dragon voice recognition software. Occasional wrong-word or sound-a-like substitutions may have occurred due to the inherent limitations of voice recognition software.      Arlisha Patalano, Canary Brim, MD 05/29/23 [phone]

## 2023-05-29 NOTE — Assessment & Plan Note (Signed)
WBC 2.7, hemoglobin 12.4, platelets 48.  Likely in setting of splenic sequestration with most likely underlying cirrhosis. -Hold pharmacologic VTE prophylaxis -AM CBC

## 2023-05-29 NOTE — H&P (Signed)
Hospital Admission History and Physical Service Pager: [phone]  Patient name: Alexander Mckenzie Medical record number: [id number] Date of Birth: 05-16-1980 Age: 43 y.o. Gender: male  Primary Care Provider: Patient, No Pcp Per Consultants: General Surgery Code Status: FULL  Preferred Emergency Contact:  Alexander Mckenzie [phone] Domingo Pulse [phone] Chief Complaint: Abdominal pain  Assessment and Plan: Alexander Mckenzie is a 43 y.o. male PMH remote hx IVDU, recently incarcerated, hx of HTN not , presenting with severe sudden new onset abdominal pain . Differential for presentation of this includes: Colitis which was seen on imaging Cholecystitis possible with cholelithiasis and right upper quadrant severe pain, consider HIDA scan tomorrow Aortic dissection-ruled out with CTA MI-Obtain EKG and troponin, less likely cause given tenderness to palpation Pancreatitis- lipase 45, not impressive on imaging SBP-possible given cirrhosis seen on CT imaging, no marked distention on exam but is very tender Testicular torsion-abdominal pain is all over abdomen, consider testicular exam if patient complains of this with CT finding of undescended testes Assessment & Plan Colitis  Cholelithiasis On CT showed cholelithiasis without cholecystitis and acute colitis involving majority of colon.  Severe abdominal pain on examination.  -Admit to FMTS, progressive, attending Dr. McDiarmid -General surgery following, appreciate recs -Consider GI involvement if not improving -Zosyn qh8 -Bowel rest (N.p.o. except sips of water and ice chips) -If fevers, consider  cultures -EKG, troponin -Pain regimen: Tylenol 500 scheduled every 6, Dilaudid 0.5 to 1 mg every 3 hours as needed -AM CBC, CMP Elevated LFTs Cirrhotic changes with splenomegaly seen on CT.  AST/ALT 255/103. Bili 3.2. -AM CMP -PT/INR -Fractionated bili -Hepatitis panel -Ethanol level -Consider right upper quadrant ultrasound if HIDA not  ordered -F/u ammonia Swelling of lower extremity Bilateral 2-3+ lower extremity edema without tenderness.  Has been ongoing for a few weeks.  Possibly related to underlying cirrhosis and low oncotic pressure. Albumin 2.0. -CMP -BNP Pancytopenia (HCC) WBC 2.7, hemoglobin 12.4, platelets 48.  Likely in setting of splenic sequestration with most likely underlying cirrhosis. -Hold pharmacologic VTE prophylaxis -AM CBC  Chronic and Stable Problems:  Hx of recent incarceration  Hx of IVDU: Check hepatitis, consider quant gold outpatient  FEN/GI: NPO, sips VTE Prophylaxis: SCDs  Disposition: Progressive  History of Present Illness:  Alexander Mckenzie is a 43 y.o. male presenting with severe abdominal pain.  Patient states that he woke up this morning at his halfway house and felt completely fine.  States that around 1 PM he had sudden very intense abdominal pain.  States that it is a constant and moving pain.  Unable to tell me whether it is sharp or dull but states that it " feels like a gremlin inside."  States that it started on the left upper abdomen and moved to the right side.  States that he had a normal bowel movement this morning.  Has not been having any nausea or vomiting.  Has been having really bad cold chills today.  No known recent fevers.  Does not remember any blood or dark-colored stools.  Has not been passing gas since the stool this morning.  He ate lunch regularly around 11 this morning.  Did not have any trauma to his abdomen.  Recently out of prison on December 11.  Denies any history of bleeding disorder in himself or family.  No history of bloody gums.  Does have allergy to Keflex and states that he gets similar rash.  Denies any alchol or drug use, gets tested at halfway house every  week.  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  Labs obtained showed lipase 45, AST/ALT 255/103, Alk phos 166, Tbili 3.2, K 3.8 and platelets low at 48, WBC 2.7, platelets.  General surgery saw patient in ED and recommended bowel rest and IV antibiotics with possible HIDA scan tomorrow with no emergent need for surgery.  Patient remained afebrile in the ED and slightly hypertensive and on room air.  Given Zosyn IV and Dilaudid 1 mg x 2 and Zofran.  Review Of Systems: Per HPI   Pertinent Past Medical History: Elevated BP at one point but off of meds No hx of heart attacks Remainder reviewed in history tab.   Pertinent Past Surgical History: SVT s/p ablation as a kid No abdominal surgeries Remainder reviewed in history tab.   Pertinent Social History: Tobacco use: Yes-smokes 1/2 PPD Alcohol use: None Other Substance use: None currently Lives at halfway house  Pertinent Family History: No known heart conditions Remainder reviewed in history tab.   Important Outpatient Medications: None Remainder reviewed in medication history.   Objective: BP (!) 145/79   Pulse 99   Temp 100.1 F (37.8 C) (Oral)   Resp 20   SpO2 100%  Exam: General: NAD, awake, alert, responsive to all questions but in severe pain Eyes: Mild scleral icterus ENTM: Moist mucous membranes Neck: bounding pulses Cardiovascular: Regular rate and rhythm, no murmurs or gallops, 2+ radial pulses bilaterally Respiratory: Clear to auscultation bilaterally, no increased work of breathing on room air Gastrointestinal: Distended, soft with no rebound or guarding, severe tenderness to light touch over upper abdomen and right lower abdomen MSK: 2+ pitting edema of the legs up to knees bilaterally Derm: Excoriations on legs, tattoos over body Neuro: Alert and oriented, no focal deficits Psych: In pain  Labs:  CBC BMET  Recent Labs  Lab 05/29/23 1535 05/29/23 1605  WBC 2.7*  --   HGB 12.4* 11.6*   HCT 36.5* 34.0*  PLT 48*  --    Recent Labs  Lab 05/29/23 1535 05/29/23 1605  NA 137 141  K 3.8 3.9  CL 108 106  CO2 23  --   BUN 7 8  CREATININE 0.77 0.70  GLUCOSE 109* 92  CALCIUM 8.2*  --      Lipase 45, AST/ALT 255/103, Alk phos 166, Tbili 3.2, K 3.8 CTA Chest/Abd-No dissection, acute colitis, cholelithiasis+pericholecystic fluid, cirrhosis with splenomegaly, multiple enlarged upper abd lymph nodes, undescended testis in R inguinal canal   EKG: Obtaining  Imaging Studies Performed:  CTA Chest/Abd-No dissection, acute colitis, cholelithiasis+pericholecystic fluid, cirrhosis with splenomegaly, multiple enlarged upper abd lymph nodes, undescended testis in R inguinal canal    Levin Erp, MD 05/29/2023, 6:46 PM PGY-3, [facility]  FPTS Intern pager: [phone], text pages welcome Secure chat group [facility] Teaching Service

## 2023-05-30 ENCOUNTER — Other Ambulatory Visit: Payer: Self-pay

## 2023-05-30 ENCOUNTER — Inpatient Hospital Stay (HOSPITAL_COMMUNITY)

## 2023-05-30 DIAGNOSIS — D65 Disseminated intravascular coagulation [defibrination syndrome]: Secondary | ICD-10-CM

## 2023-05-30 DIAGNOSIS — R6510 Systemic inflammatory response syndrome (SIRS) of non-infectious origin without acute organ dysfunction: Secondary | ICD-10-CM

## 2023-05-30 DIAGNOSIS — E8809 Other disorders of plasma-protein metabolism, not elsewhere classified: Secondary | ICD-10-CM | POA: Diagnosis present

## 2023-05-30 DIAGNOSIS — K766 Portal hypertension: Secondary | ICD-10-CM

## 2023-05-30 DIAGNOSIS — D72819 Decreased white blood cell count, unspecified: Secondary | ICD-10-CM | POA: Diagnosis present

## 2023-05-30 DIAGNOSIS — I959 Hypotension, unspecified: Secondary | ICD-10-CM | POA: Insufficient documentation

## 2023-05-30 DIAGNOSIS — K7460 Unspecified cirrhosis of liver: Secondary | ICD-10-CM | POA: Diagnosis present

## 2023-05-30 DIAGNOSIS — R1013 Epigastric pain: Secondary | ICD-10-CM

## 2023-05-30 DIAGNOSIS — D61818 Other pancytopenia: Secondary | ICD-10-CM

## 2023-05-30 DIAGNOSIS — E860 Dehydration: Secondary | ICD-10-CM | POA: Diagnosis present

## 2023-05-30 DIAGNOSIS — K7469 Other cirrhosis of liver: Secondary | ICD-10-CM

## 2023-05-30 HISTORY — DX: Portal hypertension: K76.6

## 2023-05-30 HISTORY — DX: Other disorders of plasma-protein metabolism, not elsewhere classified: E88.09

## 2023-05-30 LAB — COMPREHENSIVE METABOLIC PANEL
ALT: 79 U/L — ABNORMAL HIGH (ref 0–44)
AST: 198 U/L — ABNORMAL HIGH (ref 15–41)
Albumin: 1.5 g/dL — ABNORMAL LOW (ref 3.5–5.0)
Alkaline Phosphatase: 112 U/L (ref 38–126)
Anion gap: 4 — ABNORMAL LOW (ref 5–15)
BUN: 11 mg/dL (ref 6–20)
CO2: 23 mmol/L (ref 22–32)
Calcium: 7.4 mg/dL — ABNORMAL LOW (ref 8.9–10.3)
Chloride: 111 mmol/L (ref 98–111)
Creatinine, Ser: 0.89 mg/dL (ref 0.61–1.24)
GFR, Estimated: >60 mL/min (ref >60)
Glucose, Bld: 105 mg/dL — ABNORMAL HIGH (ref 70–99)
Potassium: 4.3 mmol/L (ref 3.5–5.1)
Sodium: 138 mmol/L (ref 135–145)
Total Bilirubin: 3.2 mg/dL — ABNORMAL HIGH (ref 0.0–1.2)
Total Protein: 5.1 g/dL — ABNORMAL LOW (ref 6.5–8.1)

## 2023-05-30 LAB — CBC WITH DIFFERENTIAL/PLATELET
Abs Immature Granulocytes: 0 10*3/uL (ref 0.00–0.07)
Basophils Absolute: 0 10*3/uL (ref 0.0–0.1)
Basophils Relative: 0 %
Eosinophils Absolute: 0 10*3/uL (ref 0.0–0.5)
Eosinophils Relative: 1 %
HCT: 29.2 % — ABNORMAL LOW (ref 39.0–52.0)
Hemoglobin: 9.9 g/dL — ABNORMAL LOW (ref 13.0–17.0)
Immature Granulocytes: 0 %
Lymphocytes Relative: 32 %
Lymphs Abs: 1.2 10*3/uL (ref 0.7–4.0)
MCH: 32.9 pg (ref 26.0–34.0)
MCHC: 33.9 g/dL (ref 30.0–36.0)
MCV: 97 fL (ref 80.0–100.0)
Monocytes Absolute: 0.4 10*3/uL (ref 0.1–1.0)
Monocytes Relative: 10 %
Neutro Abs: 2.2 10*3/uL (ref 1.7–7.7)
Neutrophils Relative %: 57 %
Platelets: 28 10*3/uL — CL (ref 150–400)
RBC: 3.01 MIL/uL — ABNORMAL LOW (ref 4.22–5.81)
RDW: 14.6 % (ref 11.5–15.5)
WBC: 3.8 10*3/uL — ABNORMAL LOW (ref 4.0–10.5)
nRBC: 0 % (ref 0.0–0.2)

## 2023-05-30 LAB — DIC (DISSEMINATED INTRAVASCULAR COAGULATION)PANEL
D-Dimer, Quant: 3.57 ug{FEU}/mL — ABNORMAL HIGH (ref 0.00–0.50)
D-Dimer, Quant: 5.94 ug{FEU}/mL — ABNORMAL HIGH (ref 0.00–0.50)
Fibrinogen: 131 mg/dL — ABNORMAL LOW (ref 210–475)
Fibrinogen: 170 mg/dL — ABNORMAL LOW (ref 210–475)
INR: 2 — ABNORMAL HIGH (ref 0.8–1.2)
INR: 2.2 — ABNORMAL HIGH (ref 0.8–1.2)
Platelets: 29 10*3/uL — CL (ref 150–400)
Platelets: 7 10*3/uL — CL (ref 150–400)
Platelets: 27 10*3/uL — CL (ref 150–400)
Prothrombin Time: 23.1 s — ABNORMAL HIGH (ref 11.4–15.2)
Prothrombin Time: 25 s — ABNORMAL HIGH (ref 11.4–15.2)
Smear Review: NONE SEEN
Smear Review: NONE SEEN
Smear Review: NONE SEEN
aPTT: 42 s — ABNORMAL HIGH (ref 24–36)
aPTT: 42 s — ABNORMAL HIGH (ref 24–36)

## 2023-05-30 LAB — TYPE AND SCREEN
ABO/RH(D): O POS
Antibody Screen: NEGATIVE

## 2023-05-30 LAB — GLUCOSE, CAPILLARY: Glucose-Capillary: 93 mg/dL (ref 70–99)

## 2023-05-30 LAB — VITAMIN B12: Vitamin B-12: 757 pg/mL (ref 180–914)

## 2023-05-30 LAB — CK: Total CK: 153 U/L (ref 49–397)

## 2023-05-30 LAB — LACTIC ACID, PLASMA
Lactic Acid, Venous: 1.6 mmol/L (ref 0.5–1.9)
Lactic Acid, Venous: 1 mmol/L (ref 0.5–1.9)

## 2023-05-30 LAB — MRSA NEXT GEN BY PCR, NASAL: MRSA by PCR Next Gen: NOT DETECTED

## 2023-05-30 LAB — HIV ANTIBODY (ROUTINE TESTING W REFLEX): HIV Screen 4th Generation wRfx: NONREACTIVE

## 2023-05-30 LAB — RPR: RPR Ser Ql: NONREACTIVE

## 2023-05-30 LAB — ABO/RH: ABO/RH(D): O POS

## 2023-05-30 LAB — FOLATE: Folate: 16.9 ng/mL (ref >5.9)

## 2023-05-30 MED ORDER — HYDROMORPHONE HCL 1 MG/ML IJ SOLN
1.0000 mg | INTRAMUSCULAR | Status: DC | PRN
  Administered 2023-05-30 – 2023-06-01 (×11): 1 mg via INTRAVENOUS
  Filled 2023-05-30 (×11): qty 1

## 2023-05-30 MED ORDER — SODIUM CHLORIDE 0.9% FLUSH
10.0000 mL | Freq: Two times a day (BID) | INTRAVENOUS | Status: DC
Start: 2023-05-30 — End: 2023-06-02
  Administered 2023-05-30 – 2023-06-02 (×7): 10 mL

## 2023-05-30 MED ORDER — SODIUM CHLORIDE 0.9 % IV BOLUS
1000.0000 mL | Freq: Once | INTRAVENOUS | Status: AC
  Administered 2023-05-30: 1000 mL via INTRAVENOUS

## 2023-05-30 MED ORDER — SODIUM CHLORIDE 0.9 % IV SOLN: INTRAVENOUS | Status: DC

## 2023-05-30 MED ORDER — HYDROMORPHONE HCL 1 MG/ML IJ SOLN
0.5000 mg | Freq: Once | INTRAMUSCULAR | Status: AC
  Administered 2023-05-30: 0.5 mg via INTRAVENOUS
  Filled 2023-05-30: qty 0.5

## 2023-05-30 MED ORDER — SODIUM CHLORIDE 0.9% IV SOLUTION: Freq: Once | INTRAVENOUS | Status: DC

## 2023-05-30 MED ORDER — SODIUM CHLORIDE 0.9% FLUSH: 10.0000 mL | INTRAVENOUS | Status: DC | PRN

## 2023-05-30 MED ORDER — CHLORHEXIDINE GLUCONATE CLOTH 2 % EX PADS
6.0000 | MEDICATED_PAD | Freq: Every day | CUTANEOUS | Status: DC
  Administered 2023-05-30 – 2023-06-02 (×4): 6 via TOPICAL

## 2023-05-30 MED ORDER — SODIUM CHLORIDE 0.9 % IV SOLN
3.0000 g | Freq: Four times a day (QID) | INTRAVENOUS | Status: DC
  Administered 2023-05-30 – 2023-06-02 (×13): 3 g via INTRAVENOUS
  Filled 2023-05-30 (×14): qty 8

## 2023-05-30 MED ORDER — METRONIDAZOLE 500 MG/100ML IV SOLN: 500.0000 mg | Freq: Two times a day (BID) | INTRAVENOUS | Status: DC

## 2023-05-30 NOTE — Consult Note (Cosign Needed)
[facility]  [phone] [phone]    HEMATOLOGY CONSULTATION  PURPOSE OF CONSULTATION/CHIEF COMPLAINT: Low Platelets  Referring MD:  Dr. Nida Mckenzie   HPI: [name] is a 43 year old male patient who came to the ED on 05/29/2023 with chief complaint of severe sudden new onset abdominal pain and nausea.   Workup was done in the ED including CBC with differential which showed low platelet count of 48K which subsequently dropped to 28K today.  Therefore hematology consult has been requested. Patient seen and assessed today.  Awake, somewhat alert, and oriented x 3 laying supine in bed.  Patient falling asleep intermittently during assessment and examination.  Patient with multiple tattoos over his face and bilateral extremities.  Reports abdominal pain came on suddenly 1 day prior to admission.  States his mother had taken him to look for a job and immediately after that he felt pain in his entire stomach, nonspecific area.  He states pain has not subsided.  Denies any active bleeding, denies melena, shortness of breath, chest pain.  No acute distress is noted at this time.  Medical history significant for colitis/cholelithiasis seen on CT scan during this admission.  Also noted were elevated LFTs and splenomegaly, and bilateral swelling of lower extremities.  He reports concern about having hepatitis C and wonders if he can be tested for it.   Surgical history: Reports he has never had any surgeries. Family history: Denies significant family history. Social history includes 5-year incarceration and recently released December 2024.  States he currently resides at a halfway house across from the hospital.   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  Denies current drug use as he gets tested at halfway house every week, denies current alcohol use.  Admits to tobacco use 5 to 6 cigarettes/day which he  started as a teenager.  ASSESSMENT AND PLAN:  1.  Pancytopenia:  Leukopenia, anemia, thrombocytopenia - May be multifactorial due to liver dysfunction/cirrhosis - WBC today is low 3.8, improvement from admission of 2.7.  No intervention at this time. - Hemoglobin today is 9.9, decreased from 11.6 at admission.  No intervention at this time.  Recommend transfuse PRBC for hemoglobin <7.0. - Platelets low 28K today, decreased from 48K upon admission. - No transfusional requirements at this time. - May consider platelet transfusion for counts <10K or less than 50K with active bleeding. - It is noted patient had low platelets of 52K 1 month ago. -Monitor CBC with differential closely - Hematology/Dr. Pamelia Mckenzie following  2.  DIC panel positive - Of note specimen was clotted.  Plus may be due to cirrhosis.   - Repeat DIC panel pending   3.  Cirrhosis/elevated LFTs History of hep C-untreated - Elevated LFTs and total bilirubin - Likely major cause of thrombocytopenia - GI consult requested to evaluate liver disease.  Patient asking about treatment for hep C. - Medicine following  4.  Abdominal pain with nausea Acute colitis, cholelithiasis without cholecystitis - Patient admitted with complaints of left-sided abdominal pain which he states remains ongoing -CT of abdomen pelvis done shows colitis, gallstones, right upper quadrant ascites. - Antiemetics and pain meds ordered - Seen by surgery, not likely cholecystitis. - Continue supportive care    Past Medical History:  Diagnosis Date   Hepatitis C    Hypertension    SVT (supraventricular tachycardia) (HCC)   :  History reviewed. No pertinent surgical  history.:  Allergies  Allergen Reactions   Keflex [Cephalexin] Rash  :  History reviewed. No pertinent family history.:   Social History   Socioeconomic History   Marital status: Single    Spouse name: Not on file   Number of children: Not on file   Years of education:  Not on file   Highest education level: Not on file  Occupational History   Not on file  Tobacco Use   Smoking status: Every Day    Types: Cigarettes   Smokeless tobacco: Never  Substance and Sexual Activity   Alcohol use: No   Drug use: Yes    Types: Marijuana   Sexual activity: Not on file  Other Topics Concern   Not on file  Social History Narrative   Not on file   Social Drivers of Health   Financial Resource Strain: Not on file  Food Insecurity: No Food Insecurity (05/29/2023)   Hunger Vital Sign    Worried About Running Out of Food in the Last Year: Never true    Ran Out of Food in the Last Year: Never true  Transportation Needs: No Transportation Needs (05/29/2023)   PRAPARE - Administrator, Civil Service (Medical): No    Lack of Transportation (Non-Medical): No  Physical Activity: Not on file  Stress: Not on file  Social Connections: Unknown (09/09/2021)   Received from [facility]   Social Network    Social Network: Not on file  Intimate Partner Violence: Not At Risk (05/29/2023)   Humiliation, Afraid, Rape, and Kick questionnaire    Fear of Current or Ex-Partner: No    Emotionally Abused: No    Physically Abused: No    Sexually Abused: No  :   CURRENT MEDS: Current Facility-Administered Medications  Medication Dose Route Frequency Provider Last Rate Last Admin   0.9 %  sodium chloride infusion (Manually program via Guardrails IV Fluids)   Intravenous Once Alexander Erp, MD       0.9 %  sodium chloride infusion   Intravenous Continuous Alexander Martinez, MD 125 mL/hr at 05/30/23 0647 Rate Change at 05/30/23 0647   acetaminophen (TYLENOL) tablet 500 mg  500 mg Oral Q6H Alexander Erp, MD   500 mg at 05/30/23 1202   Or   acetaminophen (TYLENOL) suppository 325 mg  325 mg Rectal Q6H Jagadish, Mayuri, MD       Ampicillin-Sulbactam (UNASYN) 3 g in sodium chloride 0.9 % 100 mL IVPB  3 g Intravenous Q6H Jagadish, Mayuri, MD 200 mL/hr at 05/30/23 1157 3  g at 05/30/23 1157   Chlorhexidine Gluconate Cloth 2 % PADS 6 each  6 each Topical Daily Latrelle Dodrill, MD   6 each at 05/30/23 1200   HYDROmorphone (DILAUDID) injection 1 mg  1 mg Intravenous Q3H PRN McDiarmid, Leighton Roach, MD   1 mg at 05/30/23 1302   multivitamin with minerals tablet 1 tablet  1 tablet Oral Daily Alexander Erp, MD   1 tablet at 05/29/23 1817   ondansetron (ZOFRAN) tablet 4 mg  4 mg Oral Q6H PRN Alexander Erp, MD       Or   ondansetron (ZOFRAN) injection 4 mg  4 mg Intravenous Q6H PRN Alexander Erp, MD        REVIEW OF SYSTEMS:   Constitutional: +Fatigue +generalized pain  Eyes: Denies blurriness of vision, double vision or watery eyes Ears, nose, mouth, throat, and face: Denies mucositis or sore throat Respiratory: Denies cough, dyspnea or wheezes Cardiovascular:  Denies palpitation, chest discomfort or lower extremity swelling Gastrointestinal: Denies nausea, heartburn or change in bowel habits Skin: Denies abnormal skin rashes Lymphatics: Denies new lymphadenopathy or easy bruising Neurological: Denies numbness, tingling or new weaknesses Behavioral/Psych: Mood is stable, no new changes  All other systems were reviewed with the patient and are negative.  PHYSICAL EXAMINATION: ECOG PERFORMANCE STATUS: 1 - Symptomatic but completely ambulatory  Vitals:   05/30/23 1123 05/30/23 1200  BP:  127/69  Pulse:  79  Resp:  11  Temp: 99 F (37.2 C)   SpO2:  95%   Filed Weights   05/29/23 1950  Weight: 274 lb 4 oz (124.4 kg)    GENERAL: alert, +intermittent somnolence SKIN: skin color, texture, turgor are normal, +multiple tattoos  EYES: normal, conjunctiva are pink and non-injected, sclera clear OROPHARYNX: no exudate, no erythema and lips, buccal mucosa, and tongue normal  NECK: supple, thyroid normal size, non-tender, without nodularity LYMPH: no palpable lymphadenopathy in the cervical, axillary or inguinal LUNGS: clear to auscultation and  percussion with normal breathing effort HEART: regular rate & rhythm and no murmurs and no lower extremity edema ABDOMEN: abdomen soft, non-tender and normal bowel sounds MUSCULOSKELETAL: no cyanosis of digits and no clubbing  PSYCH: alert & oriented x 3 with fluent speech NEURO: no focal motor/sensory deficits   LABS: Lab Results  Component Value Date   WBC 3.8 (L) 05/30/2023   HGB 9.9 (L) 05/30/2023   HCT 29.2 (L) 05/30/2023   MCV 97.0 05/30/2023   PLT 7 (LL) 05/30/2023    Lab Results  Component Value Date   WBC 3.8 (L) 05/30/2023   HGB 9.9 (L) 05/30/2023   HCT 29.2 (L) 05/30/2023   PLT 7 (LL) 05/30/2023   GLUCOSE 105 (H) 05/30/2023   ALT 79 (H) 05/30/2023   AST 198 (H) 05/30/2023   NA 138 05/30/2023   K 4.3 05/30/2023   CL 111 05/30/2023   CREATININE 0.89 05/30/2023   BUN 11 05/30/2023   CO2 23 05/30/2023   INR SPECIMEN CLOTTED 05/30/2023    [place] EKG SITE RITE Result Date: 05/30/2023 If Site Rite image not attached, placement could not be confirmed due to current cardiac rhythm.  CT Angio Chest/Abd/Pel for Dissection W and/or Wo Contrast Result Date: 05/29/2023 CLINICAL DATA:  Severe tearing pain in the abdomen extending into the chest. Clinical concern for acute aortic dissection. EXAM: CT ANGIOGRAPHY CHEST, ABDOMEN AND PELVIS TECHNIQUE: Non-contrast CT of the chest was initially obtained. Multidetector CT imaging through the chest, abdomen and pelvis was performed using the standard protocol during bolus administration of intravenous contrast. Multiplanar reconstructed images and MIPs were obtained and reviewed to evaluate the vascular anatomy. RADIATION DOSE REDUCTION: This exam was performed according to the departmental dose-optimization program which includes automated exposure control, adjustment of the mA and/or kV according to patient size and/or use of iterative reconstruction technique. CONTRAST:  OMNIPAQUE IOHEXOL 350 MG/ML SOLN COMPARISON:  None Available.  FINDINGS: CTA CHEST FINDINGS Cardiovascular: Atheromatous coronary artery calcifications. Normal-sized heart. No pericardial effusion. Normal-appearing thoracic aorta without aneurysm or dissection. Mediastinum/Nodes: No enlarged mediastinal, hilar, or axillary lymph nodes. Thyroid gland, trachea, and esophagus demonstrate no significant findings. Lungs/Pleura: Lungs are clear. No pleural effusion or pneumothorax. Musculoskeletal: Minimal thoracic spine degenerative changes. Review of the MIP images confirms the above findings. CTA ABDOMEN AND PELVIS FINDINGS VASCULAR Aorta: Normal, without aneurysmal dissection. Celiac: Common trunk with the superior mesenteric artery. Patent without evidence of aneurysm, dissection, vasculitis or significant stenosis. SMA:  Common trunk with the superior mesenteric artery. Patent without evidence of aneurysm, dissection, vasculitis or significant stenosis. Renals: 2 renal arteries on each side with all 4 renal arteries patent without evidence of aneurysm, dissection, vasculitis, fibromuscular dysplasia or significant stenosis. IMA: Patent without evidence of aneurysm, dissection, vasculitis or significant stenosis. Inflow: Mild bilateral iliac artery calcifications without aneurysm or dissection. Veins: No obvious venous abnormality within the limitations of this arterial phase study. Review of the MIP images confirms the above findings. NON-VASCULAR Hepatobiliary: Multiple gallstones in the gallbladder measuring up to 1.8 cm in maximum diameter each. Extensive pericholecystic fluid. Small right lobe of the liver with enlarged lateral segment left lobe and caudate lobe. Pancreas: Unremarkable. No pancreatic ductal dilatation or surrounding inflammatory changes. Spleen: Enlarged, measuring 16.1 cm in length. Adrenals/Urinary Tract: Adrenal glands are unremarkable. Kidneys are normal, without renal calculi, focal lesion, or hydronephrosis. Bladder is unremarkable. Stomach/Bowel:  Moderate to marked diffuse low-density wall thickening involving the colon, most pronounced involving the ascending colon, hepatic flexure and transverse colon. Unremarkable stomach and small bowel. Normal-appearing appendix in the right lower abdomen. Lymphatic: Multiple mildly enlarged upper abdominal lymph nodes. These are primarily mesenteric nodes. There are also enlarged aortocaval and gastrohepatic ligament nodes. The largest node is a gastrohepatic ligament node with a short axis diameter of 1.5 cm on image number 151/7. There is also an enlarged right inguinal node with a short axis diameter of 1.4 cm on image number 355/7. Reproductive: Prostate is unremarkable. Other: Small to moderate amount of free peritoneal fluid in the abdomen and pelvis. No free peritoneal air. Possible undescended testis in the inferior right inguinal canal. A moderate sized inferior inguinal hernia containing low to medium density fluid is less likely. Musculoskeletal: Unremarkable bones. Review of the MIP images confirms the above findings. IMPRESSION: 1. No aortic aneurysm or dissection. 2. Moderate to marked changes of acute colitis involving the majority of the colon, most pronounced involving the ascending colon, hepatic flexure and transverse colon. This could be infectious or inflammatory in nature. 3. Cholelithiasis with extensive pericholecystic fluid. The fluid is most likely due to the diffuse colitis. Acute cholecystitis is less likely but not excluded. 4. Small to moderate amount of free peritoneal fluid in the abdomen and pelvis. 5. 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 6. Multiple mildly enlarged upper abdominal lymph nodes, primarily mesenteric and gastrohepatic ligament nodes as well as a right inguinal node. These are nonspecific and most likely reactive. 7. Possible undescended testis in the inferior right inguinal canal. A moderate sized inferior inguinal hernia containing low to medium density  fluid is less likely. 8. Atheromatous coronary artery calcifications. These results were called by telephone at the time of interpretation on 05/29/2023 at 4:29 pm to provider [facility] , who verbally acknowledged these results. Electronically Signed   By: Beckie Salts M.D.   On: 05/29/2023 16:51     The total time spent in the appointment was 55 minutes encounter with patients including review of chart and various tests results, discussions about plan of care and coordination of care plan   All questions were answered. The patient knows to call the clinic with any problems, questions or concerns. No barriers to learning was detected.  Thank you for the courtesy of this consultation, Dawson Bills, NP  1/24/20251:19 PM  Attending Note  I personally saw the patient, reviewed the chart and examined the patient. The plan of care was discussed with the patient and the admitting team.  I agree with the assessment and plan as documented above. Thank you very much for the consultation.   Pancytopenia: Labs from  04/21/2023 revealed a platelet count of 52, hemoglobin of 11.6 and a white count of 4.3. 05/29/2023: Platelet count 48, hemoglobin 12.4, WBC 2.7 05/31/2023: Platelet count 25 (after transfusion on 05/30/2023), hemoglobin 7.8, WBC 3.5 Differential: Liver dysfunction versus infection Will obtain immature platelet fraction and reticulocyte count and LDH along with iron studies.  B12 and folic acid were normal Blood cultures were normal  2. elevated coagulation parameters: Cannot be used to diagnose DIC because of underlying liver dysfunction Marjo Bicker C cirrhosis).  However it is still a possibility.  Oral vitamin K may be used to increase synthesis of coagulation factors.  Recommendation: Transfusion as needed for platelet count less than 10 and hemoglobin less than 7  3.  Clinical suspicion of colitis: Possibly the underlying cause if this was truly DIC.  Recommendation would be to  treat on the underlying cause.  Will follow along.

## 2023-05-30 NOTE — Consult Note (Signed)
NAME:  Alexander Mckenzie, MRN:  [id number], DOB:  Feb 16, 1981, LOS: 1 ADMISSION DATE:  05/29/2023, CONSULTATION DATE: 05/30/2023 REFERRING MD:  Latrelle Dodrill, MD, CHIEF COMPLAINT: Abdominal pain  History of Present Illness:  43 year old male with prior history of IVDU, hepatitis C, with early signs of liver cirrhosis and hypertension who was brought in the emergency department with a complaint of abdominal pain.  On workup CT abdomen showed diffuse colitis and cholelithiasis.  Patient was started on antibiotics.  DIC panel was sent which showed low fibrinogen, high D-dimer elevated PT and PTT low patient's vital signs were stable, PCCM was consulted for help evaluation. During my evaluation patient is comfortably lying on the bed, complaining of mild abdominal pain.  CT abdomen showed mild ascites, bedside ultrasonography was done which showed no good pocket for ascites Tap. Also patient was noted to have platelet count dropped down to 7, 1 unit platelet transfusion as requested He denies fever, chills, nausea, dysuria, bleeding or other complaints  Pertinent  Medical History   Past Medical History:  Diagnosis Date   Hepatitis C    Hypertension    SVT (supraventricular tachycardia) (HCC)      Significant Hospital Events: Including procedures, antibiotic start and stop dates in addition to other pertinent events     Interim History / Subjective:  As above  Objective   Blood pressure 127/69, pulse 79, temperature 99 F (37.2 C), temperature source Oral, resp. rate 11, height 6\' 4"  (1.93 m), weight 124.4 kg, SpO2 95%.        Intake/Output Summary (Last 24 hours) at 05/30/2023 1322 Last data filed at 05/30/2023 0548 Gross per 24 hour  Intake 1362.47 ml  Output --  Net 1362.47 ml   Filed Weights   05/29/23 1950  Weight: 124.4 kg    Examination: General: Middle-age male, lying on the bed HEENT: [place]/AT, eyes anicteric.  moist mucus membranes Neuro: Alert, awake following  commands Chest: Coarse breath sounds, no wheezes or rhonchi Heart: Regular rate and rhythm, no murmurs or gallops Abdomen: Soft, minimally tender in left lower quadrant nondistended, bowel sounds present Skin: No rash  Labs and images reviewed Resolved Hospital Problem list     Assessment & Plan:  Abnormal coagulation panel, rule out DIC Thrombocytopenia, acute on chronic due to liver disease and now with acute illness Diffuse acute colitis Hepatitis C with liver cirrhosis  Considering patient has liver cirrhosis coagulation panel may not accurately give information If this patient had DIC he would have been really sick, he does not have bleeding from anywhere I think this DIC panel is false positive, recommend repeating labs His platelet count have dropped to 7, he is getting 1 unit platelet transfusion Recommend hematology consult Monitor platelet count and watch for signs of bleeding Continue IV antibiotics and IV fluid for diffuse acute colitis He should follow as an outpatient to GI for hepatitis C Bedside ultrasonography did not show safe to perform ascitic tap to rule out SBP but he is on antibiotics already  At this time patient does not need ICU level of care, please call with questions  Best Practice (right click and "Reselect all SmartList Selections" daily)   Per primary team  Labs   CBC: Recent Labs  Lab 05/29/23 1535 05/29/23 1605 05/30/23 0506 05/30/23 0641 05/30/23 1103  WBC 2.7*  --  3.8*  --   --   NEUTROABS  --   --  2.2  --   --   HGB  12.4* 11.6* 9.9*  --   --   HCT 36.5* 34.0* 29.2*  --   --   MCV 96.1  --  97.0  --   --   PLT 48*  --  28* 29* 7*    Basic Metabolic Panel: Recent Labs  Lab 05/29/23 1535 05/29/23 1605 05/30/23 0640  NA 137 141 138  K 3.8 3.9 4.3  CL 108 106 111  CO2 23  --  23  GLUCOSE 109* 92 105*  BUN 7 8 11   CREATININE 0.77 0.70 0.89  CALCIUM 8.2*  --  7.4*   GFR: Estimated Creatinine Clearance: 157.3 mL/min (by  C-G formula based on SCr of 0.89 mg/dL). Recent Labs  Lab 05/29/23 1535 05/29/23 1711 05/30/23 0506 05/30/23 1226  WBC 2.7*  --  3.8*  --   LATICACIDVEN 2.6* 1.8  --  1.6    Liver Function Tests: Recent Labs  Lab 05/29/23 1535 05/29/23 2009 05/30/23 0640  AST 255*  --  198*  ALT 103*  --  79*  ALKPHOS 166*  --  112  BILITOT 3.2* 3.4* 3.2*  PROT 6.5  --  5.1*  ALBUMIN 2.0*  --  1.5*   Recent Labs  Lab 05/29/23 1535  LIPASE 45   Recent Labs  Lab 05/29/23 1638  AMMONIA 51*    ABG    Component Value Date/Time   TCO2 24 05/29/2023 1605     Coagulation Profile: Recent Labs  Lab 05/29/23 1638 05/30/23 0641 05/30/23 1103  INR 1.9* 2.0* SPECIMEN CLOTTED    Cardiac Enzymes: Recent Labs  Lab 05/30/23 1020  CKTOTAL 153    HbA1C: No results found for: "HGBA1C"  CBG: Recent Labs  Lab 05/30/23 0918  GLUCAP 93    Review of Systems:   12 point review of system is significant for complaint mentioned in HPI, rest negative  Past Medical History:  He,  has a past medical history of Hepatitis C, Hypertension, and SVT (supraventricular tachycardia) (HCC).   Surgical History:  History reviewed. No pertinent surgical history.   Social History:   reports that he has been smoking cigarettes. He has never used smokeless tobacco. He reports current drug use. Drug: Marijuana. He reports that he does not drink alcohol.   Family History:  His family history is not on file.   Allergies Allergies  Allergen Reactions   Keflex [Cephalexin] Rash     Home Medications  Prior to Admission medications   Medication Sig Start Date End Date Taking? Authorizing Provider  Buprenorphine HCl-Naloxone HCl 8-2 MG FILM Place 1 Film under the tongue 3 (three) times daily. Patient not taking: Reported on 05/29/2023 04/29/23   [provider]       Cheri Fowler, MD [facility] See Amion for pager If no response to pager, please call [phone]  until 7pm After 7pm, Please call E-link [phone]

## 2023-05-30 NOTE — Care Plan (Addendum)
FMTS Interim Progress Note  S: Patient is without complaints, pain is controlled   O: BP 134/64   Pulse 81   Temp 98.2 F (36.8 C) (Oral)   Resp (!) 9   Ht 6\' 4"  (1.93 m)   Wt 124.4 kg   SpO2 95%   BMI 33.38 kg/m   General: NAD, sleeping  CV: Normal S1/S2. No extra heart sounds. Warm and well-perfused. Pulm: On room air. CTAB anteriorly. No increased WOB. Abd: guarding and diffusely tender, distension noted  Ext: Mild swelling in the feet  A/P: Patient is on mIVF, +3L from admission, monitoring extravascular volume closely.  Difficult as he is intermittently hypotensive requiring fluid resuscitation Low threshold for CCM, may need pressors in the future.  On progressive unit with PICC DIC panel + again, defer to CCM and hem/onc expertise with this as could be related to liver dysfunction No signs of focal neurologic deficit or bleeding  Rest per day team   Alfredo Martinez, MD 05/30/2023, 11:59 PM PGY-3, [facility] Service pager [phone]

## 2023-05-30 NOTE — Assessment & Plan Note (Addendum)
Bilateral upper and lower extremity edema without tenderness.  Leg swelling has ongoing for a few weeks.  Possibly related to underlying cirrhosis and low oncotic pressure. Albumin downtrending  2.0 > 1.5. Palpable radial and DP pulses bilaterally.  -AM CMP -Holding vte prophylaxis, c/f DIC

## 2023-05-30 NOTE — Assessment & Plan Note (Addendum)
DIC lab panel elevated this morning > repeat labs with worsening platelets. No signs of active bleeding. Bilateral arms diffusely swollen. No fever.  - CCM consulted - recommend 1 unit platelets - CTM for signs of bleeding/clotting

## 2023-05-30 NOTE — Assessment & Plan Note (Addendum)
Cirrhotic changes with splenomegaly seen on CT.  AST/ALT downtrending 255/103 > 198/79. Fractionated bili 2.4/1.2/2.2. PT/INR 21.5/1.9. Hep C Ab+.  -AM CMP -Hepatitis C RNA quant -Consider right upper quadrant ultrasound if HIDA not ordered -F/u ammonia

## 2023-05-30 NOTE — Plan of Care (Addendum)
Called and spoke with Mike Gip PA with GI. GI will consult on patient.

## 2023-05-30 NOTE — Plan of Care (Addendum)
S: Went to evaluate patient at bedside due to  "Possible undescended testis in the inferior right inguinal canal." On CT imaging.  O: **Chaperone Benard Rink, RN present during GU exam** -Normal male anatomy -Testes descended bilaterally -No palpable mass in scrotum, not tender, no rashes, no swelling.  A/P Normal GU exam. Low concern for an incarcerated testicle.

## 2023-05-30 NOTE — Consult Note (Addendum)
Consultation  Referring Provider: Family medicine service/[facility] Primary Care Physician:  Patient, No Pcp Per Primary Gastroenterologist: None, unassigned  Reason for Consultation: Acute severe abdominal pain, onset yesterday-no associated nausea vomiting or diarrhea   HPI: Jeray Shugart is a 43 y.o. male, with reported history of hepatitis C, history of intravenous drug abuse, currently residing at a halfway house, and hypertension.  Patient reports that he was feeling fine yesterday morning, had a normal bowel movement no melena or hematochezia.  Later in the day while he was at [facility] checking on a possible job opportunity he developed severe abdominal pain, diffuse abdomen described as tearing and sensation which doubled him over and was constant.  He was brought to the emergency room  CT angio of the chest abdomen and pelvis shows mesenteric vasculature patent, multiple gallstones in the gallbladder measuring up to 1.8 cm, extensive peri cholecystic fluid, small right lobe of the liver with enlarged lateral left segment pancreas unremarkable, spleen enlarged to 16 cm-is moderate to marked diffuse low-density wall thickening involving the colon most pronounced involving the ascending hepatic flexure and transverse colon unremarkable stomach and small bowel normal-appearing appendix, there are multiple mildly enlarged upper abdominal lymph nodes in the mesentery there is a small amount of free peritoneal fluid no free air Chest x-ray negative Labs with WBC of 2.7/hemoglobin 12.4/hematocrit 36.5/platelets 48 Lipase normal BUN 7/creatinine 0.77 T. bili 3.2/alk phos 166/AST 255/ALT 103 Lactate 2.6 Pro time 21.5/INR 1.9 acute hepatitis panel positive for hepatitis C antibody negative hepatitis A and B  HIV negative  urine drug screen pending  WBC 3.8/9/hematocrit 29.2/platelets 28 this a.m. T. bili 3.2/alk phos 112/AST 198/ALT 79 DIC panel positive and platelet count 7000 lactate  today 1.6  Has been evaluated by surgery who feel that cholecystitis is less likely  Patient was also seen by hematology this afternoon, workup in progress DIC panel being repeated, and concerned that his pancytopenia may be related to cirrhosis  Bedside ultrasound today per CCM did not show a good pocket for ascites tap just minimal ascites.  Patient says he has never had any prior GI issues, has not been on any recent new medications, no antibiotics, denies EtOH use but says he did drink heavily in his late teen years no prior known history of cirrhosis or liver disease Tolerating water today and asking for liquids Temp today 100 Still no diarrhea Is on Buprenorphine/naloxone 3 times daily sl  Past Medical History:  Diagnosis Date   Hepatitis C    Hypertension    SVT (supraventricular tachycardia) (HCC)     History reviewed. No pertinent surgical history.  Prior to Admission medications   Medication Sig Start Date End Date Taking? Authorizing Provider  Buprenorphine HCl-Naloxone HCl 8-2 MG FILM Place 1 Film under the tongue 3 (three) times daily. Patient not taking: Reported on 05/29/2023 04/29/23   [provider]    Current Facility-Administered Medications  Medication Dose Route Frequency Provider Last Rate Last Admin   0.9 %  sodium chloride infusion (Manually program via Guardrails IV Fluids)   Intravenous Once Levin Erp, MD       0.9 %  sodium chloride infusion   Intravenous Continuous Alfredo Martinez, MD 125 mL/hr at 05/30/23 0647 Rate Change at 05/30/23 0647   acetaminophen (TYLENOL) tablet 500 mg  500 mg Oral Q6H Levin Erp, MD   500 mg at 05/30/23 1202   Or   acetaminophen (TYLENOL) suppository 325 mg  325 mg Rectal Q6H Jagadish,  Mayuri, MD       Ampicillin-Sulbactam (UNASYN) 3 g in sodium chloride 0.9 % 100 mL IVPB  3 g Intravenous Q6H Laroy Apple, Mayuri, MD 200 mL/hr at 05/30/23 1157 3 g at 05/30/23 1157   Chlorhexidine Gluconate Cloth 2 % PADS 6  each  6 each Topical Daily Latrelle Dodrill, MD   6 each at 05/30/23 1200   HYDROmorphone (DILAUDID) injection 1 mg  1 mg Intravenous Q3H PRN McDiarmid, Leighton Roach, MD   1 mg at 05/30/23 1302   multivitamin with minerals tablet 1 tablet  1 tablet Oral Daily Levin Erp, MD   1 tablet at 05/29/23 1817   ondansetron (ZOFRAN) tablet 4 mg  4 mg Oral Q6H PRN Levin Erp, MD       Or   ondansetron [facility]) injection 4 mg  4 mg Intravenous Q6H PRN Levin Erp, MD        Allergies as of 05/29/2023 - Review Complete 05/29/2023  Allergen Reaction Noted   Keflex [cephalexin] Rash 07/13/2017    History reviewed. No pertinent family history.  Social History   Socioeconomic History   Marital status: Single    Spouse name: Not on file   Number of children: Not on file   Years of education: Not on file   Highest education level: Not on file  Occupational History   Not on file  Tobacco Use   Smoking status: Every Day    Types: Cigarettes   Smokeless tobacco: Never  Substance and Sexual Activity   Alcohol use: No   Drug use: Yes    Types: Marijuana   Sexual activity: Not on file  Other Topics Concern   Not on file  Social History Narrative   Not on file   Social Drivers of Health   Financial Resource Strain: Not on file  Food Insecurity: No Food Insecurity (05/29/2023)   Hunger Vital Sign    Worried About Running Out of Food in the Last Year: Never true    Ran Out of Food in the Last Year: Never true  Transportation Needs: No Transportation Needs (05/29/2023)   PRAPARE - Administrator, Civil Service (Medical): No    Lack of Transportation (Non-Medical): No  Physical Activity: Not on file  Stress: Not on file  Social Connections: Unknown (09/09/2021)   Received from [facility]   Social Network    Social Network: Not on file  Intimate Partner Violence: Not At Risk (05/29/2023)   Humiliation, Afraid, Rape, and Kick questionnaire    Fear of Current or  Ex-Partner: No    Emotionally Abused: No    Physically Abused: No    Sexually Abused: No    Review of Systems: Pertinent positive and negative review of systems were noted in the above HPI section.  All other review of systems was otherwise negative.   Physical Exam: Vital signs in last 24 hours: Temp:  [98.2 F (36.8 C)-100.1 F (37.8 C)] 99 F (37.2 C) (01/24 1123) Pulse Rate:  [77-101] 79 (01/24 1200) Resp:  [10-20] 11 (01/24 1200) BP: (91-159)/(42-104) 127/69 (01/24 1200) SpO2:  [88 %-100 %] 95 % (01/24 1200) Weight:  [124.4 kg] 124.4 kg (01/23 1950)   General:   Alert,  Well-developed, well-nourished, pleasant and cooperative in NAD, parents at bedside Head:  Normocephalic and atraumatic. Eyes:  Sclera clear, no icterus.   Conjunctiva pink. Ears:  Normal auditory acuity. Nose:  No deformity, discharge,  or lesions. Mouth:  No deformity or  lesions.   Neck:  Supple; no masses or thyromegaly. Lungs:  Clear throughout to auscultation.   No wheezes, crackles, or rhonchi.  Heart:  Regular rate and rhythm; no murmurs, clicks, rubs,  or gallops. Abdomen: Left lower quadrant left mid quadrant left upper quadrant, guarding no definite rebound BS active,nonpalp mass or hsm, no fluid wave Rectal: Not done Msk:  Symmetrical without gross deformities. . Pulses:  Normal pulses noted. Extremities: trace  edema in the ankles and feet Neurologic:  Alert and  oriented x4;  grossly normal neurologically. Skin:  Intact without significant lesions or rashes.. Psych:  Alert and cooperative. Normal mood and affect.  Intake/Output from previous day: 01/23 0701 - 01/24 0700 In: 1362.5 [I.V.:337.8; IV Piggyback:1024.7] Out: -  Intake/Output this shift: No intake/output data recorded.  Lab Results: Recent Labs    05/29/23 1535 05/29/23 1605 05/30/23 0506 05/30/23 0641 05/30/23 1103  WBC 2.7*  --  3.8*  --   --   HGB 12.4* 11.6* 9.9*  --   --   HCT 36.5* 34.0* 29.2*  --   --   PLT  48*  --  28* 29* 7*   BMET Recent Labs    05/29/23 1535 05/29/23 1605 05/30/23 0640  NA 137 141 138  K 3.8 3.9 4.3  CL 108 106 111  CO2 23  --  23  GLUCOSE 109* 92 105*  BUN 7 8 11   CREATININE 0.77 0.70 0.89  CALCIUM 8.2*  --  7.4*   LFT Recent Labs    05/29/23 2009 05/30/23 0640  PROT  --  5.1*  ALBUMIN  --  1.5*  AST  --  198*  ALT  --  79*  ALKPHOS  --  112  BILITOT 3.4* 3.2*  BILIDIR 1.2*  --   IBILI 2.2*  --    PT/INR Recent Labs    05/30/23 0641 05/30/23 1103  LABPROT 23.1* SPECIMEN CLOTTED  INR 2.0* SPECIMEN CLOTTED   Hepatitis Panel Recent Labs    05/29/23 1638  HEPBSAG NON REACTIVE  HCVAB Reactive*  HEPAIGM NON REACTIVE  HEPBIGM NON REACTIVE    IMPRESSION:   #34 43 year old white male admitted with acute onset of severe rather diffuse abdominal pain yesterday afternoon incapacitating and constant No associated nausea or vomiting or diarrhea  CT angio shows vasculature to be patent, no aneurysm, he does have a diffuse colitis, gallstones and significant pericholecystic fluid as well as changes of cirrhosis with splenomegaly and multiple mildly enlarged upper abdominal lymph nodes primarily mesenteric reactive  His pain today is rated 7 out of 10 and worse on the left side  May primarily be secondary to the acute colitis of unclear etiology but infectious versus inflammatory  With severity of pain , and acute colitis will r/o venous thrombosis   #2 gallstones and pericholecystic inflammatory changes, surgery does not feel that his symptoms are consistent with acute cholecystitis  #3 cirrhosis with splenomegaly-hep C antibody is positive, quant pending MELD 3.0: 21 at 05/30/2023  6:41 AM MELD-Na: 19 at 05/30/2023  6:41 AM Calculated from: Serum Creatinine: 0.89 mg/dL (Using min of 1 mg/dL) at 1/61/0960  4:54 AM Serum Sodium: 138 mmol/L (Using max of 137 mmol/L) at 05/30/2023  6:40 AM Total Bilirubin: 3.2 mg/dL at 0/98/1191  4:78 AM Serum  Albumin: 1.5 g/dL at 2/95/6213  0:86 AM INR(ratio): 2 at 05/30/2023  6:41 AM Age at listing (hypothetical): 42 years Sex: Male at 05/30/2023  6:41 AM    #4 pancytopenia-patient  had platelet count of 52,00  23-month ago, down to 28,000 today and on DIC screen down to 7000 Not clear whether this is secondary to underlying cirrhosis or other etiology -certainly would not expect a platelet count of 7000 from cirrhosis  #5 positive DIC panel-being repeated #6 history of IV drug abuse, on Suboxone residing at halfway house Drug screen pending  Plan; C. difficile quick screen and GI path panel Await  blood cultures Await hep C quant Start trial of clear liquids Have ordered ultrasound Doppler liver stat  GI Will follow with you         Amy Esterwood PA-C 05/30/2023, 3:18 PM     Attending physician's note   I have taken history, reviewed the chart and examined the patient. I performed a substantive portion of this encounter, including complete performance of at least one of the key components, in conjunction with the APP. I agree with the Advanced Practitioner's note, impression and recommendations.   Abdo pain - ?etiology. CTA with colitis. No acute cholecystitis (agree with Sx). Has stable cirrhosis with mildly enlarged lymph nodes.  Decompensated new onset liver cirrhosis with splenomegaly.  Hep C Ab +, Minimal ascites on [place] (not tappable). MELD 3.0: 21.  Has pancytopenia with significant thrombocytopenia (plt 28K), coagulopathy, jaundice.  No GI bleeding.  Although ammonia level is high, no encephalopathy.  H/O IVDA/methamphetamine abuse. No ETOH. H/O non-compliance.  +DIC d/t ?Sepsis ?false+  Plan: -Hep C Quat/genotype -Stool for C. Difficile/GI pathogens -Pan-culture -Check urine tox screen. -US Doppler to r/o hepatic vein thrombosis. -Liver WU for any other etiology. Check AFP. -No endo eval at this time. Very high risk    Edman Circle, MD [facility] [phone]

## 2023-05-30 NOTE — Progress Notes (Signed)
Patient ID: Alexander Mckenzie, male   DOB: May 06, 1981, 43 y.o.   MRN: [id number]   Acute Care Surgery Service Progress Note:    Chief Complaint/Subjective: C/o of L sided abd pain this am Pain about same to a little better today than yesterday. Pain was more generalized yesterday per pt  Objective: Vital signs in last 24 hours: Temp:  [98 F (36.7 C)-100.1 F (37.8 C)] 98.7 F (37.1 C) (01/24 0924) Pulse Rate:  [77-101] 77 (01/24 0924) Resp:  [10-22] 14 (01/24 0924) BP: (91-159)/(42-104) 146/68 (01/24 0924) SpO2:  [88 %-100 %] 96 % (01/24 0924) Weight:  [124.4 kg] 124.4 kg (01/23 1950)    Intake/Output from previous day: 01/23 0701 - 01/24 0700 In: 1362.5 [I.V.:337.8; IV Piggyback:1024.7] Out: -  Intake/Output this shift: No intake/output data recorded.  Lungs:  nonlabored  Cardiovascular: reg  Abd: soft, full, more left sided TTP; no peritonitis  Extremities: no edema, +SCDs  Neuro: alert, nonfocal  Some scleral icterus  Lab Results: CBC  Recent Labs    05/29/23 1535 05/29/23 1605 05/30/23 0506 05/30/23 0641  WBC 2.7*  --  3.8*  --   HGB 12.4* 11.6* 9.9*  --   HCT 36.5* 34.0* 29.2*  --   PLT 48*  --  28* 29*   BMET Recent Labs    05/29/23 1535 05/29/23 1605 05/30/23 0640  NA 137 141 138  K 3.8 3.9 4.3  CL 108 106 111  CO2 23  --  23  GLUCOSE 109* 92 105*  BUN 7 8 11   CREATININE 0.77 0.70 0.89  CALCIUM 8.2*  --  7.4*   LFT    Latest Ref Rng & Units 05/30/2023    6:40 AM 05/29/2023    8:09 PM 05/29/2023    3:35 PM  Hepatic Function  Total Protein 6.5 - 8.1 g/dL 5.1   6.5   Albumin 3.5 - 5.0 g/dL 1.5   2.0   AST 15 - 41 U/L 198   255   ALT 0 - 44 U/L 79   103   Alk Phosphatase 38 - 126 U/L 112   166   Total Bilirubin 0.0 - 1.2 mg/dL 3.2  3.4  3.2   Bilirubin, Direct 0.0 - 0.2 mg/dL  1.2     PT/INR Recent Labs    05/29/23 1638 05/30/23 0641  LABPROT 21.5* 23.1*  INR 1.9* 2.0*   ABG No results for input(s): "PHART", "HCO3" in the last 72  hours.  Invalid input(s): "PCO2", "PO2"  Studies/Results:  Anti-infectives: Anti-infectives (From admission, onward)    Start     Dose/Rate Route Frequency Ordered Stop   05/30/23 0900  Ampicillin-Sulbactam (UNASYN) 3 g in sodium chloride 0.9 % 100 mL IVPB        3 g 200 mL/hr over 30 Minutes Intravenous Every 6 hours 05/30/23 0754     05/30/23 0800  metroNIDAZOLE (FLAGYL) IVPB 500 mg  Status:  Discontinued        500 mg 100 mL/hr over 60 Minutes Intravenous Every 12 hours 05/30/23 0754 05/30/23 0807   05/29/23 1715  piperacillin-tazobactam (ZOSYN) IVPB 3.375 g  Status:  Discontinued        3.375 g 12.5 mL/hr over 240 Minutes Intravenous Every 8 hours 05/29/23 1707 05/30/23 0754       Medications: Scheduled Meds:  acetaminophen  500 mg Oral Q6H   Or   acetaminophen  325 mg Rectal Q6H   Chlorhexidine Gluconate Cloth  6 each Topical Daily  multivitamin with minerals  1 tablet Oral Daily   Continuous Infusions:  sodium chloride 125 mL/hr at 05/30/23 0647   ampicillin-sulbactam (UNASYN) IV     sodium chloride     PRN Meds:.HYDROmorphone (DILAUDID) injection, ondansetron **OR** ondansetron (ZOFRAN) IV  Assessment/Plan: Patient Active Problem List   Diagnosis Date Noted   Dehydration 05/30/2023   Cirrhosis of liver (HCC) 05/30/2023   Portal hypertension (HCC) 05/30/2023   Hypoalbuminemia 05/30/2023   SIRS (systemic inflammatory response syndrome) (HCC) 05/30/2023   Hypotension 05/30/2023   DIC (disseminated intravascular coagulation) (HCC) 05/30/2023   Colitis  Cholelithiasis 05/29/2023   Swelling of lower extremity 05/29/2023   Elevated LFTs 05/29/2023   Pancytopenia (HCC) 05/29/2023   Abdominal pain Gallstones Transaminitis and hyperbilirubinemia Colitis Ascites Leukopenia and thrombocytopenia Elevated D-dimer, low fibrinogen, elevated coagulation panel  Cirrhosis   I am not convinced the patient has cholecystitis.  His alkaline phosphatase and direct  bilirubin not terribly impressive. Pain is more left-sided today.  Can always get a right upper quadrant ultrasound to further evaluate the gallbladder; it will probably show some pericholecystic fluid given ascites in the right upper quadrant.  Could consider HIDA scan but again suspicion for cholecystitis is low  He is a Childs C cirrhotic by labs.MELD score 19  Some concern for DIC however no schistocytes seen on peripheral smear  Recommend GI medicine consult to further evaluate his liver disease May need hematology consult as well  Not much for [place] to offer from a surgical standpoint since I do not believe he has cholecystitis  Data reviewed: I reviewed labs for the past 36 hours, CT scan, H&P, ED notes, high-level medical decision making prognosis unclear  Mary Sella. Andrey Campanile, MD, FACS General, Bariatric, & Minimally Invasive Surgery [facility],  A [facility]       Disposition:  LOS: 1 day    Mary Sella. Andrey Campanile, MD, FACS General, Bariatric, & Minimally Invasive Surgery [phone] [facility], A [facility]

## 2023-05-30 NOTE — Progress Notes (Signed)
Daily Progress Note Intern Pager: [phone]  Patient name: Bernadette Armijo Medical record number: [id number] Date of birth: Aug 17, 1980 Age: 43 y.o. Gender: male  Primary Care Provider: Patient, No Pcp Per Consultants: Gen Surg, CCM, GI Code Status: Full  Pt Overview and Major Events to Date:  1/23: Admitted 1/24: + DIC labs, 1u platelets  Assessment and Plan:  Daimion Adamcik is a 43 y.o. male PMH remote hx IVDU, recently incarcerated presenting with severe sudden new onset abdominal pain .  CT with colitis, cholelithiasis, cirrhosis, splenomegaly. Found to have pancytopenia, hep b ab +, hyperbili, with + dic labs this morning and downtrending platelets. Receiving Unasyn. CCM on board. GI consulted.  Assessment & Plan DIC (disseminated intravascular coagulation) (HCC) DIC lab panel elevated this morning > repeat labs with worsening platelets. No signs of active bleeding. Bilateral arms diffusely swollen. No fever.  - CCM consulted - recommend 1 unit platelets - CTM for signs of bleeding/clotting  Colitis  Cholelithiasis On CT showed cholelithiasis without cholecystitis and acute colitis involving majority of colon.  Aortic dissection negative.  Continued severe abdominal pain with distention on examination.  -Gen Surg on board, appreciate recs - no surgical intervention at this time -Bowel rest (N.p.o. except sips of water and ice chips)  -S/p Zosyn > Transition to Unasyn given zosyn's association with lowering platelets  - Repeat lactic acid - GI consulted - Appreciate recs - If fevers, consider cultures -Pain regimen: Tylenol 500 scheduled every 6, Dilaudid 0.5 to 1 mg every 3 hours as needed -AM CBC, CMP Pancytopenia (HCC) WBC 2.7, hemoglobin 12.4, platelets 48 > 7.  Likely in setting of splenic sequestration with most likely underlying cirrhosis, now with concern for DIC per above. -Hold pharmacologic VTE prophylaxis -AM CBC -Consult Hem/onc - appreciate recommendations   Elevated LFTs Cirrhotic changes with splenomegaly seen on CT.  AST/ALT downtrending 255/103 > 198/79. Fractionated bili 2.4/1.2/2.2. PT/INR 21.5/1.9. Hep C Ab+.  -AM CMP -Hepatitis C RNA quant -Consider right upper quadrant ultrasound if HIDA not ordered -F/u ammonia Swelling of extremity Bilateral upper and lower extremity edema without tenderness.  Leg swelling has ongoing for a few weeks.  Possibly related to underlying cirrhosis and low oncotic pressure. Albumin downtrending  2.0 > 1.5. Palpable radial and DP pulses bilaterally.  -AM CMP -Holding vte prophylaxis, c/f DIC   FEN/GI: NPO (bowel rest) PPx: SCDs  Dispo: Critically ill patient  Subjective:  Patient reports continued extreme abdominal pain this morning. No difficulty breathing or chest pain.   Objective: Temp:  [98 F (36.7 C)-100.1 F (37.8 C)] 99.8 F (37.7 C) (01/23 2100) Pulse Rate:  [82-101] 85 (01/24 0600) Resp:  [10-22] 13 (01/24 0600) BP: (91-159)/(49-104) 121/56 (01/24 0600) SpO2:  [88 %-100 %] 93 % (01/24 0600) Weight:  [124.4 kg] 124.4 kg (01/23 1950) Physical Exam: General: Distressed, in acute pain  CV: Normal S1/S2. No extra heart sounds. Warm and well-perfused. Pulm: On room air. CTAB anteriorly. No increased WOB. Abd: Markedly tender with guarding. Diffusely distended. No inguinal masses appreciated bilaterally.  Skin/Ext:  Warm, dry. Diffuse bilateral upper and lower extremity swelling, nontender. 2+ radial and DP pulses bilaterally.    Laboratory: Most recent CBC Lab Results  Component Value Date   WBC 3.8 (L) 05/30/2023   HGB 9.9 (L) 05/30/2023   HCT 29.2 (L) 05/30/2023   MCV 97.0 05/30/2023   PLT 28 (LL) 05/30/2023   Most recent BMP    Latest Ref Rng & Units 05/29/2023  4:05 PM  BMP  Glucose 70 - 99 mg/dL 92   BUN 6 - 20 mg/dL 8   Creatinine 3.76 - 2.83 mg/dL 1.51   Sodium 761 - 607 mmol/L 141   Potassium 3.5 - 5.1 mmol/L 3.9   Chloride 98 - 111 mmol/L 106    DIC panel: PT  23.1, INR 2, aPTT 42, Fibrinogen 131, D-dimer 3.57, platelets 29  On repeat DIC panel, specimen clotted. Platelets 7.  Ivery Quale, MD 05/30/2023, 7:06 AM  PGY-1, [facility] Family Medicine FPTS Intern pager: [phone], text pages welcome Secure chat group [facility] Teaching Service

## 2023-05-30 NOTE — Care Plan (Incomplete)
FMTS Interim Progress Note  S: Patient is without complaints, pain is controlled   O: BP 134/64   Pulse 81   Temp 98.2 F (36.8 C) (Oral)   Resp (!) 9   Ht 6\' 4"  (1.93 m)   Wt 124.4 kg   SpO2 95%   BMI 33.38 kg/m   General: NAD, sleeping  CV: Normal S1/S2. No extra heart sounds. Warm and well-perfused. Pulm: On room air. CTAB anteriorly. No increased WOB. Abd: guarding and diffusely tender, distension noted  Ext: Mild swelling in the feet  A/P: Patient is on mIVF, +3L from admission, monitoring extravascular volume closely.  Difficult as he is intermittently hypotensive requiring fluid resuscitation Low threshold for CCM, may need pressors in the future.  On progressive unit with PICC DIC panel + again, defer to CCM and hem/onc expertise with this as could be related to liver dysfunction No signs of focal neurologic deficit or bleeding    Alfredo Martinez, MD 05/30/2023, 11:59 PM PGY-3, [facility] Family Medicine Service pager [phone]

## 2023-05-30 NOTE — Assessment & Plan Note (Addendum)
WBC 2.7, hemoglobin 12.4, platelets 48 > 7.  Likely in setting of splenic sequestration with most likely underlying cirrhosis, now with concern for DIC per above. -Hold pharmacologic VTE prophylaxis -AM CBC -Consult Hem/onc - appreciate recommendations

## 2023-05-30 NOTE — Progress Notes (Signed)
Peripherally Inserted Central Catheter Placement  The IV Nurse has discussed with the patient and/or persons authorized to consent for the patient, the purpose of this procedure and the potential benefits and risks involved with this procedure.  The benefits include less needle sticks, lab draws from the catheter, and the patient may be discharged home with the catheter. Risks include, but not limited to, infection, bleeding, blood clot (thrombus formation), and puncture of an artery; nerve damage and irregular heartbeat and possibility to perform a PICC exchange if needed/ordered by physician.  Alternatives to this procedure were also discussed.  Bard Power PICC patient education guide, fact sheet on infection prevention and patient information card has been provided to patient /or left at bedside.    PICC Placement Documentation  PICC Double Lumen 05/30/23 Right Basilic 45 cm 0 cm (Active)  Indication for Insertion or Continuance of Line Limited venous access - need for IV therapy >5 days (PICC only) 05/30/23 1628  Exposed Catheter (cm) 0 cm 05/30/23 1628  Site Assessment Clean, Dry, Intact 05/30/23 1628  Lumen #1 Status Flushed;Saline locked;Blood return noted 05/30/23 1628  Lumen #2 Status Flushed;Saline locked 05/30/23 1628  Dressing Type Transparent;Securing device 05/30/23 1628  Dressing Status Antimicrobial disc/dressing in place;Other (Comment) 05/30/23 1628  Line Care Connections checked and tightened 05/30/23 1628  Line Adjustment (NICU/IV Team Only) No 05/30/23 1628  Dressing Intervention New dressing;Adhesive placed at insertion site (IV team only) 05/30/23 1628  Dressing Change Due 06/06/23 05/30/23 1628       Vernona Rieger  Rankin Coolman 05/30/2023, 4:29 PM

## 2023-05-30 NOTE — Assessment & Plan Note (Addendum)
On CT showed cholelithiasis without cholecystitis and acute colitis involving majority of colon.  Aortic dissection negative.  Continued severe abdominal pain with distention on examination.  -Gen Surg on board, appreciate recs - no surgical intervention at this time -Bowel rest (N.p.o. except sips of water and ice chips)  -S/p Zosyn > Transition to Unasyn given zosyn's association with lowering platelets  - Repeat lactic acid - GI consulted - Appreciate recs - If fevers, consider cultures -Pain regimen: Tylenol 500 scheduled every 6, Dilaudid 0.5 to 1 mg every 3 hours as needed -AM CBC, CMP

## 2023-05-30 NOTE — Plan of Care (Signed)
Repeat platelets at 7 - Dr. Merrily Pew recommended 1U platelets and speaking with hematology. Spoke with patient and discussed that platelets indicated. Risks and benefits discussed and patient agreed. Spoke with hematologist Dr. Pamelia Hoit about positive DIC panel--hard to interpret with his liver cirrhosis. Platelets have been dropping from 29>7. Not currently bleeding or clotting anywhere. They will consult on patient.

## 2023-05-30 NOTE — Plan of Care (Addendum)
Called CCM about patient's DIC panel being positive. Pts pressures low 90s/40s > 1 L NS bolus ordered. D/w Dr. McDiarmid-changed Zosyn to Unasyn, cultures ordered, UA with microscopy ordered. CCM provider to see pt today to provide recommendations for DIC.  Addendum: Messaged by Dr. Merrily Pew states likely DIC panel false positive due to liver cirrhosis. He is repeating level but would recommend treating underlying illness. He doesn't have enough asicitis to tap to r/o SBP.

## 2023-05-31 DIAGNOSIS — R1084 Generalized abdominal pain: Secondary | ICD-10-CM

## 2023-05-31 LAB — HCV RNA QUANT
HCV Quantitative Log: 6.22 {Log} (ref >1.70)
HCV Quantitative: 1660000 [IU]/mL (ref >50)

## 2023-05-31 LAB — PREPARE PLATELET PHERESIS: Unit division: 0

## 2023-05-31 LAB — COMPREHENSIVE METABOLIC PANEL
ALT: 58 U/L — ABNORMAL HIGH (ref 0–44)
AST: 127 U/L — ABNORMAL HIGH (ref 15–41)
Albumin: <1.5 g/dL — ABNORMAL LOW (ref 3.5–5.0)
Alkaline Phosphatase: 93 U/L (ref 38–126)
Anion gap: 4 — ABNORMAL LOW (ref 5–15)
BUN: 9 mg/dL (ref 6–20)
CO2: 20 mmol/L — ABNORMAL LOW (ref 22–32)
Calcium: 6.3 mg/dL — CL (ref 8.9–10.3)
Chloride: 115 mmol/L — ABNORMAL HIGH (ref 98–111)
Creatinine, Ser: 0.7 mg/dL (ref 0.61–1.24)
GFR, Estimated: >60 mL/min (ref >60)
Glucose, Bld: 87 mg/dL (ref 70–99)
Potassium: 3 mmol/L — ABNORMAL LOW (ref 3.5–5.1)
Sodium: 139 mmol/L (ref 135–145)
Total Bilirubin: 2.4 mg/dL — ABNORMAL HIGH (ref 0.0–1.2)
Total Protein: 4.2 g/dL — ABNORMAL LOW (ref 6.5–8.1)

## 2023-05-31 LAB — URINALYSIS, ROUTINE W REFLEX MICROSCOPIC
Glucose, UA: NEGATIVE mg/dL
Ketones, ur: NEGATIVE mg/dL
Leukocytes,Ua: NEGATIVE
Nitrite: NEGATIVE
Protein, ur: NEGATIVE mg/dL
Specific Gravity, Urine: 1.038 — ABNORMAL HIGH (ref 1.005–1.030)
pH: 5 (ref 5.0–8.0)

## 2023-05-31 LAB — BPAM PLATELET PHERESIS
Blood Product Expiration Date: 202501252359
ISSUE DATE / TIME: 202501241810
Unit Type and Rh: 6200

## 2023-05-31 LAB — CBC
HCT: 23.7 % — ABNORMAL LOW (ref 39.0–52.0)
HCT: 29.6 % — ABNORMAL LOW (ref 39.0–52.0)
Hemoglobin: 7.8 g/dL — ABNORMAL LOW (ref 13.0–17.0)
Hemoglobin: 9.9 g/dL — ABNORMAL LOW (ref 13.0–17.0)
MCH: 32.6 pg (ref 26.0–34.0)
MCH: 33 pg (ref 26.0–34.0)
MCHC: 32.9 g/dL (ref 30.0–36.0)
MCHC: 33.4 g/dL (ref 30.0–36.0)
MCV: 99.2 fL (ref 80.0–100.0)
MCV: 98.7 fL (ref 80.0–100.0)
Platelets: 25 10*3/uL — CL (ref 150–400)
Platelets: 32 10*3/uL — ABNORMAL LOW (ref 150–400)
RBC: 2.39 MIL/uL — ABNORMAL LOW (ref 4.22–5.81)
RBC: 3 MIL/uL — ABNORMAL LOW (ref 4.22–5.81)
RDW: 14.6 % (ref 11.5–15.5)
RDW: 14.6 % (ref 11.5–15.5)
WBC: 3.5 10*3/uL — ABNORMAL LOW (ref 4.0–10.5)
WBC: 3.9 10*3/uL — ABNORMAL LOW (ref 4.0–10.5)
nRBC: 0 % (ref 0.0–0.2)
nRBC: 0 % (ref 0.0–0.2)

## 2023-05-31 LAB — RETICULOCYTES
Immature Retic Fract: 5.8 % (ref 2.3–15.9)
RBC.: 2.38 MIL/uL — ABNORMAL LOW (ref 4.22–5.81)
Retic Count, Absolute: 42.6 10*3/uL (ref 19.0–186.0)
Retic Ct Pct: 1.8 % (ref 0.4–3.1)

## 2023-05-31 LAB — LACTATE DEHYDROGENASE: LDH: 141 U/L (ref 98–192)

## 2023-05-31 LAB — IMMATURE PLATELET FRACTION: Immature Platelet Fraction: 6.2 % (ref 1.2–8.6)

## 2023-05-31 MED ORDER — POTASSIUM CHLORIDE 20 MEQ PO PACK
60.0000 meq | PACK | Freq: Once | ORAL | Status: AC
Start: 2023-05-31 — End: 2023-05-31
  Administered 2023-05-31: 60 meq via ORAL

## 2023-05-31 MED ORDER — POTASSIUM CHLORIDE 20 MEQ PO PACK
40.0000 meq | PACK | Freq: Once | ORAL | Status: DC
  Filled 2023-05-31: qty 2

## 2023-05-31 MED ORDER — CALCIUM CARBONATE 1250 (500 CA) MG PO TABS
1.0000 | ORAL_TABLET | Freq: Once | ORAL | Status: AC
Start: 2023-05-31 — End: 2023-05-31
  Administered 2023-05-31: 1250 mg via ORAL
  Filled 2023-05-31: qty 1

## 2023-05-31 MED ORDER — CALCIUM CARBONATE 1250 (500 CA) MG PO TABS
1.0000 | ORAL_TABLET | Freq: Once | ORAL | Status: DC
  Filled 2023-05-31: qty 1

## 2023-05-31 NOTE — Plan of Care (Signed)
  Problem: Activity: Goal: Risk for activity intolerance will decrease 05/31/2023 0710 by West Carbo, RN Outcome: Progressing 05/31/2023 0709 by West Carbo, RN Outcome: Progressing   Problem: Nutrition: Goal: Adequate nutrition will be maintained 05/31/2023 0710 by West Carbo, RN Outcome: Progressing 05/31/2023 0709 by West Carbo, RN Outcome: Progressing

## 2023-05-31 NOTE — Progress Notes (Signed)
   05/30/23 2308  Provider Notification  Provider Name/Title Everhart  Date Provider Notified 05/30/23  Time Provider Notified 2308  Method of Notification Page  Notification Reason Critical Result  Test performed and critical result Platlets-27  Date Critical Result Received 05/30/23  Time Critical Result Received 2306  Provider response No new orders  Date of Provider Response 05/30/23  Time of Provider Response 2309

## 2023-05-31 NOTE — Progress Notes (Addendum)
Daily Progress Note Intern Pager: [phone]  Patient name: Dreden Rivere Medical record number: [id number] Date of birth: 06-09-1980 Age: 43 y.o. Gender: male  Primary Care Provider: Patient, No Pcp Per Consultants: GI, Heme, CCM Code Status: FULL  Pt Overview and Major Events to Date:  1/23 Admitted 1/24 DIC Panel ?positive-likely d/t cirrhosis, 1 U platelets  Assessment and Plan:  Rocky Gladden is a 43 y.o. male PMH remote hx IVDU, recently incarcerated, hx of HTN, hx of hep C presenting with severe sudden new onset abdominal pain.  Assessment & Plan Acute Colitis  Cholelithiasis On CT showed cirrhosis + splenomegaly, cholelithiasis without cholecystitis, and acute colitis involving majority of colon. RUQ u/s ruled out hepatic venous thrombosis.  -Gen Surg following, appreciate recs - no surgical intervention at this time, not felt to be acute cholecystitis -GI following, appreciate recs -Fluids, timed out this AM, monitor BP -Clear Liquid Diet, advance per GI recs -Unasyn  -F/u Bcx, GIPP, Cdiff -Pain regimen: Tylenol 500 scheduled every 6, Dilaudid 1 mg every 3 hours as needed (consider PO pain meds today) -AM CBC, CMP Pancytopenia (HCC) WBC 2.7>3.5, hemoglobin 12.4>9.9>7.8, platelets 48 > 7.  Likely in setting of splenic sequestration with most likely underlying cirrhosis, ? DIC but no bleeding or thrombosis and patient appears stable currently.  -Hold pharmacologic VTE prophylaxis -GI following, too high risk for EGD -PM CBC -AM CBC -Hem/onc following - appreciate recommendations  Elevated LFTs  Cirrhosis Cirrhotic changes with splenomegaly seen on CT.  AST/ALT downtrending 198/79>127/58. Hep C Ab+. -GI following, too high risk for EGD  -AM CMP -Hepatitis C RNA quant -Consider spiro/lasix as BP tolerates +DIC Panel DIC lab panel positive yesterday but possibly false positive d/t liver cirrhosis. No signs of active bleeding or thrombotic events. -Heme  following -CTM for signs of bleeding/clotting  Swelling of extremity Bilateral upper and lower extremity edema without tenderness.  Leg swelling has ongoing for a few weeks.  Possibly related to underlying cirrhosis and low oncotic pressure.  -AM CMP -Holding pharmacologic VTE prophylaxis -Hold fluids for now  FEN/GI: Clear Liquid Diet PPx: SCDs Dispo: Pending medical improvement  Subjective:  Patient states his abdominal pain is improving. No BM in hospital. No bleeding or pain anywhere besides abdomen.  Objective: Temp:  [98.2 F (36.8 C)-99 F (37.2 C)] 98.5 F (36.9 C) (01/25 0400) Pulse Rate:  [71-99] 71 (01/25 0700) Resp:  [8-14] 10 (01/25 0700) BP: (96-150)/(44-74) 116/62 (01/25 0700) SpO2:  [90 %-98 %] 94 % (01/25 0700) Weight:  [123.8 kg] 123.8 kg (01/25 0646) Physical Exam: General: NAD, awake, alert, comfortable appearing in bed Cardiovascular: RRR, no murmurs/gallops Respiratory: CTAB in anterior lung fields, no increased work of breathing on room air Abdomen: Mildly distended, mildly tender to palpation in right upper quadrant and right lower quadrant, no rebound or guarding, normoactive bowel sounds Extremities: 2+ pitting edema in lower extremities bilaterally, some nonpitting edema of bilateral upper extremities, 2+ radial pulses bilaterally  Laboratory: Most recent CBC Lab Results  Component Value Date   WBC 3.5 (L) 05/31/2023   HGB 7.8 (L) 05/31/2023   HCT 23.7 (L) 05/31/2023   MCV 99.2 05/31/2023   PLT 25 (LL) 05/31/2023   Most recent BMP    Latest Ref Rng & Units 05/31/2023    4:23 AM  BMP  Glucose 70 - 99 mg/dL 87   BUN 6 - 20 mg/dL 9   Creatinine 2.84 - 1.32 mg/dL 4.40   Sodium 102 - 725  mmol/L 139   Potassium 3.5 - 5.1 mmol/L 3.0   Chloride 98 - 111 mmol/L 115   CO2 22 - 32 mmol/L 20   Calcium 8.9 - 10.3 mg/dL 6.3    Imaging/Diagnostic Tests: RUQ u/s-no portal vein thrombus; cirrhosis+ascites   Levin Erp, MD 05/31/2023, 7:17  AM  PGY-3, [facility] FPTS Intern pager: [phone], text pages welcome Secure chat group [facility] Teaching Service

## 2023-05-31 NOTE — Progress Notes (Signed)
Subjective/Chief Complaint: Some left-sided abdominal pain Afebrile Hemodynamically stable Utilizing PRN Dilaudid about every 3 hours WBC stable LFT's slightly improved  Objective: Vital signs in last 24 hours: Temp:  [98.2 F (36.8 C)-99 F (37.2 C)] 98.6 F (37 C) (01/25 0741) Pulse Rate:  [71-99] 77 (01/25 0800) Resp:  [8-14] 10 (01/25 0800) BP: (96-150)/(44-74) 123/70 (01/25 0800) SpO2:  [90 %-97 %] 95 % (01/25 0800) Weight:  [123.8 kg] 123.8 kg (01/25 0646) Last BM Date : 05/29/23  Intake/Output from previous day: 01/24 0701 - 01/25 0700 In: 4270.1 [I.V.:2757.5; Blood:162.5; IV Piggyback:1350] Out: 1120 [Urine:1120] Intake/Output this shift: No intake/output data recorded.  Abd - soft, some mild left-sided tenderness  Lab Results:  Recent Labs    05/30/23 0506 05/30/23 0641 05/30/23 2230 05/31/23 0423  WBC 3.8*  --   --  3.5*  HGB 9.9*  --   --  7.8*  HCT 29.2*  --   --  23.7*  PLT 28*   < > 27* 25*   < > = values in this interval not displayed.   BMET Recent Labs    05/30/23 0640 05/31/23 0423  NA 138 139  K 4.3 3.0*  CL 111 115*  CO2 23 20*  GLUCOSE 105* 87  BUN 11 9  CREATININE 0.89 0.70  CALCIUM 7.4* 6.3*   PT/INR Recent Labs    05/30/23 1103 05/30/23 2230  LABPROT SPECIMEN CLOTTED 25.0*  INR SPECIMEN CLOTTED 2.2*      Latest Ref Rng & Units 05/31/2023    4:23 AM 05/30/2023    6:40 AM 05/29/2023    8:09 PM  Hepatic Function  Total Protein 6.5 - 8.1 g/dL 4.2  5.1    Albumin 3.5 - 5.0 g/dL <1.6  1.5    AST 15 - 41 U/L 127  198    ALT 0 - 44 U/L 58  79    Alk Phosphatase 38 - 126 U/L 93  112    Total Bilirubin 0.0 - 1.2 mg/dL 2.4  3.2  3.4   Bilirubin, Direct 0.0 - 0.2 mg/dL   1.2      Studies/Results: [place] ABD LTD RUG W/LIVER DOPPLER Result Date: 05/30/2023 CLINICAL DATA:  Elevated LFTs. EXAM: DUPLEX ULTRASOUND OF LIVER TECHNIQUE: Color and duplex Doppler ultrasound was performed to evaluate the hepatic in-flow and out-flow  vessels. COMPARISON:  CTA yesterday. FINDINGS: Liver: Heterogeneous hepatic echogenicity. Subtle capsular nodularity. No focal lesion, mass or intrahepatic biliary ductal dilatation. Main Portal Vein size: 1.4 cm Portal Vein Velocities Main Prox:  61.5 cm/sec Main Mid: 69.3 cm/sec Main Dist:  73.5 cm/sec Right: 62 cm/sec Left: 50.4 cm/sec Hepatic Vein Velocities Right:  53 cm/sec Middle:  33 cm/sec Left:  35 cm/sec IVC: Not evaluated. Hepatic Artery Velocity:  114.5 cm/sec Splenic Vein Velocity:  18.9 cm/sec Spleen: 17.4 cm x 4 cm x 5.5 cm with a total volume of 267 cm^3 (411 cm^3 is upper limit normal). This likely underestimates splenic volume due to caliper placement. Portal Vein Occlusion/Thrombus: No Splenic Vein Occlusion/Thrombus: No Ascites: Present. Varices: None Gallstones are noted.  Thickened gallbladder wall. IMPRESSION: 1. Patent portal veins with elevated velocities, suggesting portal hypertension. No portal vein thrombus. 2. Cirrhosis with ascites. 3. Splenomegaly on yesterday's CT is not demonstrated on the current exam, likely due to caliper placement. Electronically Signed   By: Narda Rutherford M.D.   On: 05/30/2023 21:14   [place] EKG SITE RITE Result Date: 05/30/2023 If [facility] image not attached,  placement could not be confirmed due to current cardiac rhythm.  CT Angio Chest/Abd/Pel for Dissection W and/or Wo Contrast Result Date: 05/29/2023 CLINICAL DATA:  Severe tearing pain in the abdomen extending into the chest. Clinical concern for acute aortic dissection. EXAM: CT ANGIOGRAPHY CHEST, ABDOMEN AND PELVIS TECHNIQUE: Non-contrast CT of the chest was initially obtained. Multidetector CT imaging through the chest, abdomen and pelvis was performed using the standard protocol during bolus administration of intravenous contrast. Multiplanar reconstructed images and MIPs were obtained and reviewed to evaluate the vascular anatomy. RADIATION DOSE REDUCTION: This exam was performed according to  the departmental dose-optimization program which includes automated exposure control, adjustment of the mA and/or kV according to patient size and/or use of iterative reconstruction technique. CONTRAST:  OMNIPAQUE IOHEXOL 350 MG/ML SOLN COMPARISON:  None Available. FINDINGS: CTA CHEST FINDINGS Cardiovascular: Atheromatous coronary artery calcifications. Normal-sized heart. No pericardial effusion. Normal-appearing thoracic aorta without aneurysm or dissection. Mediastinum/Nodes: No enlarged mediastinal, hilar, or axillary lymph nodes. Thyroid gland, trachea, and esophagus demonstrate no significant findings. Lungs/Pleura: Lungs are clear. No pleural effusion or pneumothorax. Musculoskeletal: Minimal thoracic spine degenerative changes. Review of the MIP images confirms the above findings. CTA ABDOMEN AND PELVIS FINDINGS VASCULAR Aorta: Normal, without aneurysmal dissection. Celiac: Common trunk with the superior mesenteric artery. Patent without evidence of aneurysm, dissection, vasculitis or significant stenosis. SMA: Common trunk with the superior mesenteric artery. Patent without evidence of aneurysm, dissection, vasculitis or significant stenosis. Renals: 2 renal arteries on each side with all 4 renal arteries patent without evidence of aneurysm, dissection, vasculitis, fibromuscular dysplasia or significant stenosis. IMA: Patent without evidence of aneurysm, dissection, vasculitis or significant stenosis. Inflow: Mild bilateral iliac artery calcifications without aneurysm or dissection. Veins: No obvious venous abnormality within the limitations of this arterial phase study. Review of the MIP images confirms the above findings. NON-VASCULAR Hepatobiliary: Multiple gallstones in the gallbladder measuring up to 1.8 cm in maximum diameter each. Extensive pericholecystic fluid. Small right lobe of the liver with enlarged lateral segment left lobe and caudate lobe. Pancreas: Unremarkable. No pancreatic ductal  dilatation or surrounding inflammatory changes. Spleen: Enlarged, measuring 16.1 cm in length. Adrenals/Urinary Tract: Adrenal glands are unremarkable. Kidneys are normal, without renal calculi, focal lesion, or hydronephrosis. Bladder is unremarkable. Stomach/Bowel: Moderate to marked diffuse low-density wall thickening involving the colon, most pronounced involving the ascending colon, hepatic flexure and transverse colon. Unremarkable stomach and small bowel. Normal-appearing appendix in the right lower abdomen. Lymphatic: Multiple mildly enlarged upper abdominal lymph nodes. These are primarily mesenteric nodes. There are also enlarged aortocaval and gastrohepatic ligament nodes. The largest node is a gastrohepatic ligament node with a short axis diameter of 1.5 cm on image number 151/7. There is also an enlarged right inguinal node with a short axis diameter of 1.4 cm on image number 355/7. Reproductive: Prostate is unremarkable. Other: Small to moderate amount of free peritoneal fluid in the abdomen and pelvis. No free peritoneal air. Possible undescended testis in the inferior right inguinal canal. A moderate sized inferior inguinal hernia containing low to medium density fluid is less likely. Musculoskeletal: Unremarkable bones. Review of the MIP images confirms the above findings. IMPRESSION: 1. No aortic aneurysm or dissection. 2. Moderate to marked changes of acute colitis involving the majority of the colon, most pronounced involving the ascending colon, hepatic flexure and transverse colon. This could be infectious or inflammatory in nature. 3. Cholelithiasis with extensive pericholecystic fluid. The fluid is most likely due to the diffuse colitis. Acute cholecystitis  is less likely but not excluded. 4. Small to moderate amount of free peritoneal fluid in the abdomen and pelvis. 5. 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 6. Multiple mildly enlarged upper abdominal lymph nodes, primarily  mesenteric and gastrohepatic ligament nodes as well as a right inguinal node. These are nonspecific and most likely reactive. 7. Possible undescended testis in the inferior right inguinal canal. A moderate sized inferior inguinal hernia containing low to medium density fluid is less likely. 8. Atheromatous coronary artery calcifications. These results were called by telephone at the time of interpretation on 05/29/2023 at 4:29 pm to provider [facility] , who verbally acknowledged these results. Electronically Signed   By: Beckie Salts M.D.   On: 05/29/2023 16:51    Anti-infectives: Anti-infectives (From admission, onward)    Start     Dose/Rate Route Frequency Ordered Stop   05/30/23 0900  Ampicillin-Sulbactam (UNASYN) 3 g in sodium chloride 0.9 % 100 mL IVPB        3 g 200 mL/hr over 30 Minutes Intravenous Every 6 hours 05/30/23 0754     05/30/23 0800  metroNIDAZOLE (FLAGYL) IVPB 500 mg  Status:  Discontinued        500 mg 100 mL/hr over 60 Minutes Intravenous Every 12 hours 05/30/23 0754 05/30/23 0807   05/29/23 1715  piperacillin-tazobactam (ZOSYN) IVPB 3.375 g  Status:  Discontinued        3.375 g 12.5 mL/hr over 240 Minutes Intravenous Every 8 hours 05/29/23 1707 05/30/23 0754       Assessment/Plan: Abdominal pain Gallstones Transaminitis and hyperbilirubinemia Colitis Ascites Leukopenia and thrombocytopenia Elevated D-dimer, low fibrinogen, elevated coagulation panel Cirrhosis   Clinical picture more consistent with colitis and not cholecystitis.  HIDA scan may be less sensitive due to his hepatic dysfunction from cirrhosis.   He is a Programme researcher, broadcasting/film/video C cirrhotic by labs. MELD score 19     Recommend GI medicine consult to further evaluate his liver disease May need hematology consult as well   No indications for any surgical intervention at this time.  The patient would be very high risk for perioperative mortality and complications due to his cirrhosis.   LOS: 2 days     Wynona Luna 05/31/2023

## 2023-05-31 NOTE — Assessment & Plan Note (Addendum)
WBC 2.7>3.5, hemoglobin 12.4>9.9>7.8, platelets 48 > 7.  Likely in setting of splenic sequestration with most likely underlying cirrhosis, ? DIC but no bleeding or thrombosis and patient appears stable currently.  -Hold pharmacologic VTE prophylaxis -GI following, too high risk for EGD -PM CBC -AM CBC -Hem/onc following - appreciate recommendations

## 2023-05-31 NOTE — Assessment & Plan Note (Signed)
Cirrhotic changes with splenomegaly seen on CT.  AST/ALT downtrending 198/79>127/58. Hep C Ab+. -GI following, too high risk for EGD  -AM CMP -Hepatitis C RNA quant -Consider spiro/lasix as BP tolerates

## 2023-05-31 NOTE — Plan of Care (Signed)

## 2023-05-31 NOTE — Assessment & Plan Note (Addendum)
Bilateral upper and lower extremity edema without tenderness.  Leg swelling has ongoing for a few weeks.  Possibly related to underlying cirrhosis and low oncotic pressure.  -AM CMP -Holding pharmacologic VTE prophylaxis -Hold fluids for now

## 2023-05-31 NOTE — Plan of Care (Signed)
Problem: Activity: Goal: Risk for activity intolerance will decrease Outcome: Progressing   Problem: Nutrition: Goal: Adequate nutrition will be maintained Outcome: Progressing

## 2023-05-31 NOTE — Progress Notes (Addendum)
Patient ID: Alexander Mckenzie, male   DOB: [birth year], 43 y.o.   MRN: [id number]     Progress Note   Subjective   Day # 2 CC; acute onset of severe generalized abdominal pain 05/29/2023  Dilaudid as needed-using every 3 hours IV Unasyn  Patient says his abdominal pain has improved, was sleeping when I entered the room, awakened on exam, asking for nurse for pain medication No nausea or vomiting, no stools no diarrhea  Path panel and C. difficile quick screen not collected as no stools Lactate normalized to 1.0  WBC 3.5/hemoglobin 7.8/hematocrit 23.7/platelets 25 Potassium 3.0/BUN 9/creatinine 0.7/calcium 6.3 T. bili 2.4/alk phos 93/AST 127/ALT 58  hep C RNA quant pending Repeat DIC screen positive-INR 2.2 Rapid urine drug screen never collected or resulted  Blood cultures negative thus far   Objective   Vital signs in last 24 hours: Temp:  [98.2 F (36.8 C)-99 F (37.2 C)] 98.6 F (37 C) (01/25 0741) Pulse Rate:  [71-99] 77 (01/25 0800) Resp:  [8-14] 10 (01/25 0800) BP: (96-150)/(44-74) 123/70 (01/25 0800) SpO2:  [90 %-95 %] 95 % (01/25 0800) Weight:  [123.8 kg] 123.8 kg (01/25 0646) Last BM Date : 05/29/23 General:   White male in NAD resting comfortably in bed Heart:  Regular rate and rhythm; no murmurs Lungs: Respirations even and unlabored, lungs CTA bilaterally Abdomen:  Soft, obese, still has some tenderness in the mid abdomen and across mid abdomen no rebound. Normal bowel sounds. Extremities:  Without edema. Neurologic:  Alert and oriented,  grossly normal neurologically. Psych:  Cooperative. Normal mood and affect.  Intake/Output from previous day: 01/24 0701 - 01/25 0700 In: 4270.1 [I.V.:2757.5; Blood:162.5; IV Piggyback:1350] Out: 1120 [Urine:1120] Intake/Output this shift: No intake/output data recorded.  Lab Results: Recent Labs    05/29/23 1535 05/29/23 1605 05/30/23 0506 05/30/23 0641 05/30/23 1103 05/30/23 2230 05/31/23 0423  WBC 2.7*  --   3.8*  --   --   --  3.5*  HGB 12.4* 11.6* 9.9*  --   --   --  7.8*  HCT 36.5* 34.0* 29.2*  --   --   --  23.7*  PLT 48*  --  28*   < > 7* 27* 25*   < > = values in this interval not displayed.   BMET Recent Labs    05/29/23 1535 05/29/23 1605 05/30/23 0640 05/31/23 0423  NA 137 141 138 139  K 3.8 3.9 4.3 3.0*  CL 108 106 111 115*  CO2 23  --  23 20*  GLUCOSE 109* 92 105* 87  BUN 7 8 11 9   CREATININE 0.77 0.70 0.89 0.70  CALCIUM 8.2*  --  7.4* 6.3*   LFT Recent Labs    05/29/23 2009 05/30/23 0640 05/31/23 0423  PROT  --    < > 4.2*  ALBUMIN  --    < > <1.5*  AST  --    < > 127*  ALT  --    < > 58*  ALKPHOS  --    < > 93  BILITOT 3.4*   < > 2.4*  BILIDIR 1.2*  --   --   IBILI 2.2*  --   --    < > = values in this interval not displayed.   PT/INR Recent Labs    05/30/23 1103 05/30/23 2230  LABPROT SPECIMEN CLOTTED 25.0*  INR SPECIMEN CLOTTED 2.2*    Studies/Results: [place] ABD LTD RUG W/LIVER DOPPLER Result Date: 05/30/2023 CLINICAL DATA:  Elevated LFTs. EXAM: DUPLEX ULTRASOUND OF LIVER TECHNIQUE: Color and duplex Doppler ultrasound was performed to evaluate the hepatic in-flow and out-flow vessels. COMPARISON:  CTA yesterday. FINDINGS: Liver: Heterogeneous hepatic echogenicity. Subtle capsular nodularity. No focal lesion, mass or intrahepatic biliary ductal dilatation. Main Portal Vein size: 1.4 cm Portal Vein Velocities Main Prox:  61.5 cm/sec Main Mid: 69.3 cm/sec Main Dist:  73.5 cm/sec Right: 62 cm/sec Left: 50.4 cm/sec Hepatic Vein Velocities Right:  53 cm/sec Middle:  33 cm/sec Left:  35 cm/sec IVC: Not evaluated. Hepatic Artery Velocity:  114.5 cm/sec Splenic Vein Velocity:  18.9 cm/sec Spleen: 17.4 cm x 4 cm x 5.5 cm with a total volume of 267 cm^3 (411 cm^3 is upper limit normal). This likely underestimates splenic volume due to caliper placement. Portal Vein Occlusion/Thrombus: No Splenic Vein Occlusion/Thrombus: No Ascites: Present. Varices: None Gallstones are  noted.  Thickened gallbladder wall. IMPRESSION: 1. Patent portal veins with elevated velocities, suggesting portal hypertension. No portal vein thrombus. 2. Cirrhosis with ascites. 3. Splenomegaly on yesterday's CT is not demonstrated on the current exam, likely due to caliper placement. Electronically Signed   By: Narda Rutherford M.D.   On: 05/30/2023 21:14   [place] EKG SITE RITE Result Date: 05/30/2023 If Site Rite image not attached, placement could not be confirmed due to current cardiac rhythm.  CT Angio Chest/Abd/Pel for Dissection W and/or Wo Contrast Result Date: 05/29/2023 CLINICAL DATA:  Severe tearing pain in the abdomen extending into the chest. Clinical concern for acute aortic dissection. EXAM: CT ANGIOGRAPHY CHEST, ABDOMEN AND PELVIS TECHNIQUE: Non-contrast CT of the chest was initially obtained. Multidetector CT imaging through the chest, abdomen and pelvis was performed using the standard protocol during bolus administration of intravenous contrast. Multiplanar reconstructed images and MIPs were obtained and reviewed to evaluate the vascular anatomy. RADIATION DOSE REDUCTION: This exam was performed according to the departmental dose-optimization program which includes automated exposure control, adjustment of the mA and/or kV according to patient size and/or use of iterative reconstruction technique. CONTRAST:  OMNIPAQUE IOHEXOL 350 MG/ML SOLN COMPARISON:  None Available. FINDINGS: CTA CHEST FINDINGS Cardiovascular: Atheromatous coronary artery calcifications. Normal-sized heart. No pericardial effusion. Normal-appearing thoracic aorta without aneurysm or dissection. Mediastinum/Nodes: No enlarged mediastinal, hilar, or axillary lymph nodes. Thyroid gland, trachea, and esophagus demonstrate no significant findings. Lungs/Pleura: Lungs are clear. No pleural effusion or pneumothorax. Musculoskeletal: Minimal thoracic spine degenerative changes. Review of the MIP images confirms the above  findings. CTA ABDOMEN AND PELVIS FINDINGS VASCULAR Aorta: Normal, without aneurysmal dissection. Celiac: Common trunk with the superior mesenteric artery. Patent without evidence of aneurysm, dissection, vasculitis or significant stenosis. SMA: Common trunk with the superior mesenteric artery. Patent without evidence of aneurysm, dissection, vasculitis or significant stenosis. Renals: 2 renal arteries on each side with all 4 renal arteries patent without evidence of aneurysm, dissection, vasculitis, fibromuscular dysplasia or significant stenosis. IMA: Patent without evidence of aneurysm, dissection, vasculitis or significant stenosis. Inflow: Mild bilateral iliac artery calcifications without aneurysm or dissection. Veins: No obvious venous abnormality within the limitations of this arterial phase study. Review of the MIP images confirms the above findings. NON-VASCULAR Hepatobiliary: Multiple gallstones in the gallbladder measuring up to 1.8 cm in maximum diameter each. Extensive pericholecystic fluid. Small right lobe of the liver with enlarged lateral segment left lobe and caudate lobe. Pancreas: Unremarkable. No pancreatic ductal dilatation or surrounding inflammatory changes. Spleen: Enlarged, measuring 16.1 cm in length. Adrenals/Urinary Tract: Adrenal glands are unremarkable. Kidneys are normal, without renal calculi, focal  lesion, or hydronephrosis. Bladder is unremarkable. Stomach/Bowel: Moderate to marked diffuse low-density wall thickening involving the colon, most pronounced involving the ascending colon, hepatic flexure and transverse colon. Unremarkable stomach and small bowel. Normal-appearing appendix in the right lower abdomen. Lymphatic: Multiple mildly enlarged upper abdominal lymph nodes. These are primarily mesenteric nodes. There are also enlarged aortocaval and gastrohepatic ligament nodes. The largest node is a gastrohepatic ligament node with a short axis diameter of 1.5 cm on image number  151/7. There is also an enlarged right inguinal node with a short axis diameter of 1.4 cm on image number 355/7. Reproductive: Prostate is unremarkable. Other: Small to moderate amount of free peritoneal fluid in the abdomen and pelvis. No free peritoneal air. Possible undescended testis in the inferior right inguinal canal. A moderate sized inferior inguinal hernia containing low to medium density fluid is less likely. Musculoskeletal: Unremarkable bones. Review of the MIP images confirms the above findings. IMPRESSION: 1. No aortic aneurysm or dissection. 2. Moderate to marked changes of acute colitis involving the majority of the colon, most pronounced involving the ascending colon, hepatic flexure and transverse colon. This could be infectious or inflammatory in nature. 3. Cholelithiasis with extensive pericholecystic fluid. The fluid is most likely due to the diffuse colitis. Acute cholecystitis is less likely but not excluded. 4. Small to moderate amount of free peritoneal fluid in the abdomen and pelvis. 5. 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 6. Multiple mildly enlarged upper abdominal lymph nodes, primarily mesenteric and gastrohepatic ligament nodes as well as a right inguinal node. These are nonspecific and most likely reactive. 7. Possible undescended testis in the inferior right inguinal canal. A moderate sized inferior inguinal hernia containing low to medium density fluid is less likely. 8. Atheromatous coronary artery calcifications. These results were called by telephone at the time of interpretation on 05/29/2023 at 4:29 pm to provider [facility] , who verbally acknowledged these results. Electronically Signed   By: Beckie Salts M.D.   On: 05/29/2023 16:51       Assessment / Plan:    #17 43 year old white male with acute onset of severe abdominal pain 05/29/2003 constant tearing in nature and persistent  CT angio chest abdomen and pelvis showed multiple gallstones,  pericholecystic fluid cirrhotic appearing liver splenomegaly and diffuse wall thickening involving the colon most marked in the ascending hepatic and transverse, multiple probable reactive upper abdominal lymph nodes  Surgery does not think he has acute cholecystitis, symptoms more consistent with an acute colitis although unusual to not have any diarrhea or bowel movements Acute inflammatory process etiology not clear Question Toxic exposure (urine drug screen pending)  Ultrasound Doppler last p.m. 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 Rule out substance abuse precipitating above   Plan; Advance diet as tolerates Continue IV unasyn Continue IV PPI Await pending labs Repeat urine drug screen and serum drug screen  Principal Problem:   Acute Colitis  Cholelithiasis Active Problems:   Swelling of extremity   Elevated LFTs  Cirrhosis   Pancytopenia (HCC)   Dehydration   Cirrhosis of  liver (HCC)   Portal hypertension (HCC)   Hypoalbuminemia   SIRS (systemic inflammatory response syndrome) (HCC)   Hypotension   +DIC Panel     LOS: 2 days   Amy Esterwood PA-C 05/31/2023, 9:55 AM     Attending physician's note   I have taken history, reviewed the chart and examined the patient. I performed a substantive portion of this encounter, including complete performance of at least one of the key components, in conjunction with the APP. I agree with the Advanced Practitioner's note, impression and recommendations.   Abdo pain - ?etiology.  Better now.  No cholecystitis per surgery. No colitis clinically (no diarrhea). Neg liver Doppler for hepatic vein thrombosis.  Similar presentation in 2021 @ [facility].  New Dx Hep C/ETOH liver cirrhosis with pHTN. Minimal ascites on [place] (not tappable). MELD 3.0: 21.  Has pancytopenia with significant thrombocytopenia, coagulopathy, jaundice.  No GI bleeding or hepatic encephalopathy.  Continued IVDA/methamphetamine abuse. Not a candidate for liver transplant  Plan: -Continue supportive treatment. -FU with ID for Hep C Rx -Follow urine tox screen -No plans for endoscopic eval at this time. -Advance diet as tolerated -Will sign off for now. Pl call if any change in clinical status or any ?Marland Kitchen   Edman Circle, MD [facility] [phone]

## 2023-05-31 NOTE — Assessment & Plan Note (Addendum)
On CT showed cirrhosis + splenomegaly, cholelithiasis without cholecystitis, and acute colitis involving majority of colon. RUQ u/s ruled out hepatic venous thrombosis.  -Gen Surg following, appreciate recs - no surgical intervention at this time, not felt to be acute cholecystitis -GI following, appreciate recs -Fluids, timed out this AM, monitor BP -Clear Liquid Diet, advance per GI recs -Unasyn  -F/u Bcx, GIPP, Cdiff -Pain regimen: Tylenol 500 scheduled every 6, Dilaudid 1 mg every 3 hours as needed (consider PO pain meds today) -AM CBC, CMP

## 2023-05-31 NOTE — Assessment & Plan Note (Signed)
DIC lab panel positive yesterday but possibly false positive d/t liver cirrhosis. No signs of active bleeding or thrombotic events. -Heme following -CTM for signs of bleeding/clotting

## 2023-05-31 NOTE — Plan of Care (Addendum)
FMTS Brief Progress Note  S:Went bedside to see patient. Patient resting comfortably and awakens to voice. Reports doing well, no complaints, tolerate food well today.    O: BP (!) 118/58 (BP Location: Left Arm)   Pulse 67   Temp 98.3 F (36.8 C)   Resp 12   Ht 6\' 4"  (1.93 m)   Wt 123.8 kg   SpO2 95%   BMI 33.22 kg/m   General: NAD Card: RRR, NRMG Resp: CTABL Abd: Soft, mild TTP in epigastric region Ext: Moving all extremities, no edema  A/P: Acute Colitis / Cholelithiasis Patient tolerated food okay today. Pain well controlled.  -Plans per day team - Orders reviewed. Labs for AM ordered, which was adjusted as needed.   Bess Kinds, MD 05/31/2023, 9:18 PM PGY-3, [facility] Night Resident  Please page [phone] with questions.

## 2023-06-01 ENCOUNTER — Inpatient Hospital Stay (HOSPITAL_COMMUNITY)

## 2023-06-01 DIAGNOSIS — K7460 Unspecified cirrhosis of liver: Secondary | ICD-10-CM

## 2023-06-01 DIAGNOSIS — M7989 Other specified soft tissue disorders: Secondary | ICD-10-CM

## 2023-06-01 LAB — COMPREHENSIVE METABOLIC PANEL
ALT: 60 U/L — ABNORMAL HIGH (ref 0–44)
AST: 122 U/L — ABNORMAL HIGH (ref 15–41)
Albumin: <1.5 g/dL — ABNORMAL LOW (ref 3.5–5.0)
Alkaline Phosphatase: 109 U/L (ref 38–126)
Anion gap: 3 — ABNORMAL LOW (ref 5–15)
BUN: 7 mg/dL (ref 6–20)
CO2: 24 mmol/L (ref 22–32)
Calcium: 7.5 mg/dL — ABNORMAL LOW (ref 8.9–10.3)
Chloride: 111 mmol/L (ref 98–111)
Creatinine, Ser: 0.62 mg/dL (ref 0.61–1.24)
GFR, Estimated: >60 mL/min (ref >60)
Glucose, Bld: 85 mg/dL (ref 70–99)
Potassium: 3.9 mmol/L (ref 3.5–5.1)
Sodium: 138 mmol/L (ref 135–145)
Total Bilirubin: 1.6 mg/dL — ABNORMAL HIGH (ref 0.0–1.2)
Total Protein: 5.2 g/dL — ABNORMAL LOW (ref 6.5–8.1)

## 2023-06-01 LAB — CBC
HCT: 28.6 % — ABNORMAL LOW (ref 39.0–52.0)
Hemoglobin: 9.6 g/dL — ABNORMAL LOW (ref 13.0–17.0)
MCH: 33.2 pg (ref 26.0–34.0)
MCHC: 33.6 g/dL (ref 30.0–36.0)
MCV: 99 fL (ref 80.0–100.0)
Platelets: 34 10*3/uL — ABNORMAL LOW (ref 150–400)
RBC: 2.89 MIL/uL — ABNORMAL LOW (ref 4.22–5.81)
RDW: 14.6 % (ref 11.5–15.5)
WBC: 3.4 10*3/uL — ABNORMAL LOW (ref 4.0–10.5)
nRBC: 0 % (ref 0.0–0.2)

## 2023-06-01 NOTE — Assessment & Plan Note (Addendum)
On CT showed cirrhosis + splenomegaly, cholelithiasis without cholecystitis, and acute colitis involving majority of colon. No surgical intervention at this time, not felt to be acute cholecystitis. Tolerated CLD well yesterday, pain well controlled. Will advance to soft/bland diet. -Gen Surg following, appreciate recs  -Advance to Bland / Soft Diet -Unasyn  -F/u Bcx, GIPP, Cdiff -Pain regimen: Tylenol 500 scheduled every 6, Dilaudid 1 mg every 3 hours as needed (consider PO pain meds today) -AM CBC, CMP

## 2023-06-01 NOTE — Assessment & Plan Note (Addendum)
WBC stable at 3.4 (3.9), hemoglobin stable at 9.6 (9.9), platelets stable at 34 (32).  Likely in setting of splenic sequestration with most likely underlying cirrhosis, ? DIC but no bleeding or thrombosis and patient appears stable currently. RUQ u/s ruled out hepatic venous thrombosis.  -Hold pharmacologic VTE prophylaxis -AM CBC -Hem/onc following - appreciate recommendations

## 2023-06-01 NOTE — Assessment & Plan Note (Addendum)
Bilateral upper and lower extremity edema without tenderness.  Leg swelling has been ongoing for a few weeks, but is improved today, hand swelling slightly worse per patient. Possibly related to underlying cirrhosis and low oncotic pressure.  -AM CMP -Holding pharmacologic VTE prophylaxis -Hold fluids for now

## 2023-06-01 NOTE — Progress Notes (Signed)
VASCULAR LAB    Right upper extremity venous duplex has been performed.  See CV proc for preliminary results.   Donae Kueker, RVT 06/01/2023, 5:03 PM

## 2023-06-01 NOTE — Plan of Care (Signed)

## 2023-06-01 NOTE — Assessment & Plan Note (Addendum)
Cirrhotic changes with splenomegaly seen on CT.  AST/ALT downtrending 127/58 > 122/60. Hep C Ab+, plan for outpatient f/u w/ ID.  -AM CMP -Consider spiro/lasix as BP tolerates

## 2023-06-01 NOTE — Progress Notes (Signed)
Daily Progress Note Intern Pager: [phone]  Patient name: Alexander Mckenzie Medical record number: [id number] Date of birth: April 08, 1981 Age: 43 y.o. Gender: male  Primary Care Provider: Patient, No Pcp Per Consultants: GI s/o, Gen Surg, Heme Onc Code Status: Full  Pt Overview and Major Events to Date:  1/23 Admitted 1/24 DIC Panel ?positive-likely d/t cirrhosis, 1 U platelets 1/25 Clear liquid diet, tolerated well, GI s/o  Assessment and Plan:  Alexander Mckenzie is a 43 y.o. male PMH remote hx IVDU, recently incarcerated, hx of HTN, hx of hep C presenting with severe sudden new onset abdominal pain.  Assessment & Plan Acute Colitis  Cholelithiasis On CT showed cirrhosis + splenomegaly, cholelithiasis without cholecystitis, and acute colitis involving majority of colon. No surgical intervention at this time, not felt to be acute cholecystitis. Tolerated CLD well yesterday, pain well controlled. Will advance to soft/bland diet. -Gen Surg following, appreciate recs  -Advance to Bland / Soft Diet -Unasyn  -F/u Bcx, GIPP, Cdiff -Pain regimen: Tylenol 500 scheduled every 6, Dilaudid 1 mg every 3 hours as needed (consider PO pain meds today) -AM CBC, CMP Other pancytopenia (HCC) WBC stable at 3.4 (3.9), hemoglobin stable at 9.6 (9.9), platelets stable at 34 (32).  Likely in setting of splenic sequestration with most likely underlying cirrhosis, ? DIC but no bleeding or thrombosis and patient appears stable currently. RUQ u/s ruled out hepatic venous thrombosis.  -Hold pharmacologic VTE prophylaxis -AM CBC -Hem/onc following - appreciate recommendations  Elevated LFTs  Cirrhosis Cirrhotic changes with splenomegaly seen on CT.  AST/ALT downtrending 127/58 > 122/60. Hep C Ab+, plan for outpatient f/u w/ ID.  -AM CMP -Consider spiro/lasix as BP tolerates +DIC Panel DIC lab panel positive but possibly false positive d/t liver cirrhosis. No signs of active bleeding or thrombotic events. -Heme  following -CTM for signs of bleeding/clotting  Swelling of extremity Bilateral upper and lower extremity edema without tenderness.  Leg swelling has been ongoing for a few weeks, but is improved today, hand swelling slightly worse per patient. Possibly related to underlying cirrhosis and low oncotic pressure.  -AM CMP -Holding pharmacologic VTE prophylaxis -Hold fluids for now  FEN/GI: Soft/Bland diet PPx: SCDs Dispo: pending clinical improvement .   Subjective:  Doing well this morning, pain okay if not moving.   Objective: Temp:  [98 F (36.7 C)-98.6 F (37 C)] 98 F (36.7 C) (01/26 0002) Pulse Rate:  [61-77] 70 (01/26 0002) Resp:  [8-14] 12 (01/26 0002) BP: (101-123)/(48-70) 101/66 (01/26 0002) SpO2:  [90 %-97 %] 96 % (01/26 0002) Weight:  [123.8 kg] 123.8 kg (01/25 0646) Physical Exam: General: NAD, good mood Cardiovascular: RRR, NRMG Respiratory: CTABL Abdomen: Mildly distended, mild TTP in epigastric area Extremities: Decreased swelling in BLE, slightly worse in RUE, moving all extremities  Laboratory: Most recent CBC Lab Results  Component Value Date   WBC 3.9 (L) 05/31/2023   HGB 9.9 (L) 05/31/2023   HCT 29.6 (L) 05/31/2023   MCV 98.7 05/31/2023   PLT 32 (L) 05/31/2023   Most recent BMP    Latest Ref Rng & Units 05/31/2023    4:23 AM  BMP  Glucose 70 - 99 mg/dL 87   BUN 6 - 20 mg/dL 9   Creatinine 5.64 - 3.32 mg/dL 9.51   Sodium 884 - 166 mmol/L 139   Potassium 3.5 - 5.1 mmol/L 3.0   Chloride 98 - 111 mmol/L 115   CO2 22 - 32 mmol/L 20   Calcium 8.9 -  10.3 mg/dL 6.3      Bess Kinds, MD 06/01/2023, 5:28 AM  PGY-3, [facility] FPTS Intern pager: [phone], text pages welcome Secure chat group [facility] Teaching Service

## 2023-06-01 NOTE — Plan of Care (Signed)

## 2023-06-01 NOTE — Assessment & Plan Note (Signed)
DIC lab panel positive but possibly false positive d/t liver cirrhosis. No signs of active bleeding or thrombotic events. -Heme following -CTM for signs of bleeding/clotting

## 2023-06-01 NOTE — Progress Notes (Signed)
Subjective/Chief Complaint: Afebrile Hemodynamically stable WBC stable LFT's slightly improved  Objective: Vital signs in last 24 hours: Temp:  [97.7 F (36.5 C)-98.4 F (36.9 C)] 97.9 F (36.6 C) (01/26 1100) Pulse Rate:  [61-76] 62 (01/26 0800) Resp:  [7-14] 8 (01/26 0800) BP: (101-124)/(56-70) 114/56 (01/26 0800) SpO2:  [94 %-97 %] 95 % (01/26 0800) Last BM Date : 05/29/23  Intake/Output from previous day: 01/25 0701 - 01/26 0700 In: 210 [I.V.:10; IV Piggyback:200] Out: 400 [Urine:400] Intake/Output this shift: Total I/O In: 10 [I.V.:10] Out: -   Abd - soft, some mild left-sided tenderness  Lab Results:  Recent Labs    05/31/23 1700 06/01/23 0552  WBC 3.9* 3.4*  HGB 9.9* 9.6*  HCT 29.6* 28.6*  PLT 32* 34*   BMET Recent Labs    05/31/23 0423 06/01/23 0552  NA 139 138  K 3.0* 3.9  CL 115* 111  CO2 20* 24  GLUCOSE 87 85  BUN 9 7  CREATININE 0.70 0.62  CALCIUM 6.3* 7.5*   PT/INR Recent Labs    05/30/23 1103 05/30/23 2230  LABPROT SPECIMEN CLOTTED 25.0*  INR SPECIMEN CLOTTED 2.2*      Latest Ref Rng & Units 06/01/2023    5:52 AM 05/31/2023    4:23 AM 05/30/2023    6:40 AM  Hepatic Function  Total Protein 6.5 - 8.1 g/dL 5.2  4.2  5.1   Albumin 3.5 - 5.0 g/dL <1.6  <1.0  1.5   AST 15 - 41 U/L 122  127  198   ALT 0 - 44 U/L 60  58  79   Alk Phosphatase 38 - 126 U/L 109  93  112   Total Bilirubin 0.0 - 1.2 mg/dL 1.6  2.4  3.2      Studies/Results: [place] ABD LTD RUG W/LIVER DOPPLER Result Date: 05/30/2023 CLINICAL DATA:  Elevated LFTs. EXAM: DUPLEX ULTRASOUND OF LIVER TECHNIQUE: Color and duplex Doppler ultrasound was performed to evaluate the hepatic in-flow and out-flow vessels. COMPARISON:  CTA yesterday. FINDINGS: Liver: Heterogeneous hepatic echogenicity. Subtle capsular nodularity. No focal lesion, mass or intrahepatic biliary ductal dilatation. Main Portal Vein size: 1.4 cm Portal Vein Velocities Main Prox:  61.5 cm/sec Main Mid: 69.3  cm/sec Main Dist:  73.5 cm/sec Right: 62 cm/sec Left: 50.4 cm/sec Hepatic Vein Velocities Right:  53 cm/sec Middle:  33 cm/sec Left:  35 cm/sec IVC: Not evaluated. Hepatic Artery Velocity:  114.5 cm/sec Splenic Vein Velocity:  18.9 cm/sec Spleen: 17.4 cm x 4 cm x 5.5 cm with a total volume of 267 cm^3 (411 cm^3 is upper limit normal). This likely underestimates splenic volume due to caliper placement. Portal Vein Occlusion/Thrombus: No Splenic Vein Occlusion/Thrombus: No Ascites: Present. Varices: None Gallstones are noted.  Thickened gallbladder wall. IMPRESSION: 1. Patent portal veins with elevated velocities, suggesting portal hypertension. No portal vein thrombus. 2. Cirrhosis with ascites. 3. Splenomegaly on yesterday's CT is not demonstrated on the current exam, likely due to caliper placement. Electronically Signed   By: Narda Rutherford M.D.   On: 05/30/2023 21:14   [place] EKG SITE RITE Result Date: 05/30/2023 If Site Rite image not attached, placement could not be confirmed due to current cardiac rhythm.   Anti-infectives: Anti-infectives (From admission, onward)    Start     Dose/Rate Route Frequency Ordered Stop   05/30/23 0900  Ampicillin-Sulbactam (UNASYN) 3 g in sodium chloride 0.9 % 100 mL IVPB        3 g 200 mL/hr over 30  Minutes Intravenous Every 6 hours 05/30/23 0754     05/30/23 0800  metroNIDAZOLE (FLAGYL) IVPB 500 mg  Status:  Discontinued        500 mg 100 mL/hr over 60 Minutes Intravenous Every 12 hours 05/30/23 0754 05/30/23 0807   05/29/23 1715  piperacillin-tazobactam (ZOSYN) IVPB 3.375 g  Status:  Discontinued        3.375 g 12.5 mL/hr over 240 Minutes Intravenous Every 8 hours 05/29/23 1707 05/30/23 0754       Assessment/Plan: Abdominal pain Gallstones Transaminitis and hyperbilirubinemia Colitis Ascites Leukopenia and thrombocytopenia Elevated D-dimer, low fibrinogen, elevated coagulation panel Cirrhosis   Clinical picture more consistent with colitis and not  cholecystitis.  HIDA scan may be less sensitive due to his hepatic dysfunction from cirrhosis.   He is a Programme researcher, broadcasting/film/video C cirrhotic by labs. MELD score 19     Recommend GI medicine consult to further evaluate his liver disease May need hematology consult as well   No indications for any surgical intervention at this time.  The patient would be very high risk for perioperative mortality and complications due to his cirrhosis.   LOS: 3 days    Vanita Panda 06/01/2023

## 2023-06-02 ENCOUNTER — Encounter (HOSPITAL_COMMUNITY): Payer: Self-pay | Admitting: Student

## 2023-06-02 ENCOUNTER — Other Ambulatory Visit (HOSPITAL_COMMUNITY): Payer: Self-pay

## 2023-06-02 LAB — CBC
HCT: 29.8 % — ABNORMAL LOW (ref 39.0–52.0)
Hemoglobin: 10 g/dL — ABNORMAL LOW (ref 13.0–17.0)
MCH: 33.1 pg (ref 26.0–34.0)
MCHC: 33.6 g/dL (ref 30.0–36.0)
MCV: 98.7 fL (ref 80.0–100.0)
Platelets: 39 10*3/uL — ABNORMAL LOW (ref 150–400)
RBC: 3.02 MIL/uL — ABNORMAL LOW (ref 4.22–5.81)
RDW: 14.3 % (ref 11.5–15.5)
WBC: 2.5 10*3/uL — ABNORMAL LOW (ref 4.0–10.5)
nRBC: 0 % (ref 0.0–0.2)

## 2023-06-02 LAB — COMPREHENSIVE METABOLIC PANEL
ALT: 56 U/L — ABNORMAL HIGH (ref 0–44)
AST: 118 U/L — ABNORMAL HIGH (ref 15–41)
Albumin: <1.5 g/dL — ABNORMAL LOW (ref 3.5–5.0)
Alkaline Phosphatase: 109 U/L (ref 38–126)
Anion gap: 4 — ABNORMAL LOW (ref 5–15)
BUN: 9 mg/dL (ref 6–20)
CO2: 24 mmol/L (ref 22–32)
Calcium: 7.5 mg/dL — ABNORMAL LOW (ref 8.9–10.3)
Chloride: 110 mmol/L (ref 98–111)
Creatinine, Ser: 0.66 mg/dL (ref 0.61–1.24)
GFR, Estimated: >60 mL/min (ref >60)
Glucose, Bld: 111 mg/dL — ABNORMAL HIGH (ref 70–99)
Potassium: 4.1 mmol/L (ref 3.5–5.1)
Sodium: 138 mmol/L (ref 135–145)
Total Bilirubin: 1.4 mg/dL — ABNORMAL HIGH (ref 0.0–1.2)
Total Protein: 5 g/dL — ABNORMAL LOW (ref 6.5–8.1)

## 2023-06-02 LAB — RAPID URINE DRUG SCREEN, HOSP PERFORMED
Amphetamines: POSITIVE — AB
Barbiturates: NOT DETECTED
Benzodiazepines: NOT DETECTED
Cocaine: NOT DETECTED
Opiates: NOT DETECTED
Tetrahydrocannabinol: NOT DETECTED

## 2023-06-02 LAB — PROTIME-INR
INR: 1.9 — ABNORMAL HIGH (ref 0.8–1.2)
Prothrombin Time: 21.9 s — ABNORMAL HIGH (ref 11.4–15.2)

## 2023-06-02 MED ORDER — AMOXICILLIN-POT CLAVULANATE 875-125 MG PO TABS
1.0000 | ORAL_TABLET | Freq: Two times a day (BID) | ORAL | 0 refills | Status: AC
  Filled 2023-06-02: qty 7, 4d supply, fill #0

## 2023-06-02 MED ORDER — CARMEX CLASSIC LIP BALM EX OINT
TOPICAL_OINTMENT | CUTANEOUS | Status: DC | PRN
  Filled 2023-06-02: qty 10

## 2023-06-02 NOTE — Progress Notes (Addendum)
Daily Progress Note Intern Pager: [phone]  Patient name: Alexander Mckenzie Medical record number: [id number] Date of birth: 08-Aug-1980 Age: 43 y.o. Gender: male  Primary Care Provider: Patient, No Pcp Per Consultants: Gen Surg, CCM, GI Code Status: Full   Pt Overview and Major Events to Date:  1/23: Admitted 1/24: + DIC labs, 1u platelets  Assessment and Plan:  Alexander Mckenzie is a 43 y.o. male PMH remote hx IVDU, recently incarcerated, hx of HTN, hx of hep C presenting with severe sudden new onset abdominal pain, found to have acute colitis. Initially with +DIC labs, received 1 unit platelets, heme following. GI/Gen surg do not recommend surgical intervention.  Advancing diet, pain well-controlled. Assessment & Plan Acute Colitis  Cholelithiasis On CT showed cirrhosis + splenomegaly, cholelithiasis without cholecystitis, and acute colitis involving majority of colon. No surgical intervention at this time, not felt to be acute cholecystitis. Tolerated CLD well, pain well controlled.  UDS positive for amphetamines.  Tolerated soft/bland diet yesterday.  Bowel movement this morning. -Appreciate surgery and GI recs -Advance to regular diet -S/p Unasyn - transition to Augmentin today for remaining abx course (plan for 7 day total abx) -F/u Bcx - NG 3D -Pain regimen: Tylenol 500 scheduled every 6, D/C Dilaudid Other pancytopenia (HCC) WBC stable at 3.4 (3.9), hemoglobin stable at 9.6 (9.9), platelets stable at 34 (32).  Likely in setting of splenic sequestration with most likely underlying cirrhosis. Initial +DIC, s/p 1 u platelets with improvement. RUQ u/s ruled out hepatic venous thrombosis.  -Hold pharmacologic VTE prophylaxis -Appreciate heme recs Elevated LFTs  Cirrhosis Cirrhotic changes with splenomegaly seen on CT.  AST/ALT downtrending 127/58 > 122/60 > 118/56. Hep C Ab+, plan for outpatient f/u w/ ID.  -AM CMP -Consider spiro/lasix as BP tolerates +DIC Panel DIC lab panel  positive but possibly false positive d/t liver cirrhosis. No signs of active bleeding or thrombotic events.  Prothrombin and INR downtrending. -Appreciate heme recs -CTM for signs of bleeding/clotting  Swelling of extremity Bilateral upper and lower extremity edema without tenderness. Possibly related to underlying cirrhosis and low oncotic pressure. RUE DVT US negative.  Improving swelling today. -Holding pharmacologic VTE prophylaxis -Hold fluids for now   FEN/GI: Regular diet PPx: SCDs Dispo: pending clinical improvement .  Subjective:  Patient reports feeling better this morning.  Tolerated bland diet yesterday.  Reported bowel movement this morning.  Belly feels bloated.  Pain overall improving and well-controlled at this time.  Swelling of arms and legs improving.  Objective: Temp:  [97.7 F (36.5 C)-98.3 F (36.8 C)] 98 F (36.7 C) (01/27 0308) Pulse Rate:  [53-67] 56 (01/27 0600) Resp:  [7-15] 14 (01/27 0600) BP: (99-127)/(56-71) 99/56 (01/27 0400) SpO2:  [95 %-98 %] 95 % (01/27 0600) Physical Exam: General: No acute distress. Resting comfortably in room. CV: Normal S1/S2. No extra heart sounds. Warm and well-perfused. Pulm: Breathing comfortably on room air. CTAB anteriorly. No increased WOB. Abd: Improving distention.  Mildly tender. Skin/Ext:  Warm, dry.  Improving upper and lower extremity swelling, nontender. Psych: Pleasant and appropriate.    Laboratory: Most recent CBC Lab Results  Component Value Date   WBC 3.4 (L) 06/01/2023   HGB 9.6 (L) 06/01/2023   HCT 28.6 (L) 06/01/2023   MCV 99.0 06/01/2023   PLT 34 (L) 06/01/2023   Most recent BMP    Latest Ref Rng & Units 06/01/2023    5:52 AM  BMP  Glucose 70 - 99 mg/dL 85   BUN  6 - 20 mg/dL 7   Creatinine 2.95 - 2.84 mg/dL 1.32   Sodium 440 - 102 mmol/L 138   Potassium 3.5 - 5.1 mmol/L 3.9   Chloride 98 - 111 mmol/L 111   CO2 22 - 32 mmol/L 24   Calcium 8.9 - 10.3 mg/dL 7.5     Imaging/Diagnostic  Tests: RUE DVT (prelim): No evidence of deep vein thrombosis in the upper extremity. No evidence of superficial vein thrombosis in the upper extremity. Subcutaneous edema noted throughout the forearm.   Ivery Quale, MD 06/02/2023, 7:11 AM  PGY-1, [facility] Family Medicine FPTS Intern pager: [phone], text pages welcome Secure chat group [facility] Teaching Service

## 2023-06-02 NOTE — Assessment & Plan Note (Addendum)
On CT showed cirrhosis + splenomegaly, cholelithiasis without cholecystitis, and acute colitis involving majority of colon. No surgical intervention at this time, not felt to be acute cholecystitis. Tolerated CLD well, pain well controlled.  UDS positive for amphetamines.  Tolerated soft/bland diet yesterday.  Bowel movement this morning. -Appreciate surgery and GI recs -Advance to regular diet -S/p Unasyn - transition to Augmentin today for remaining abx course (plan for 7 day total abx) -F/u Bcx - NG 3D -Pain regimen: Tylenol 500 scheduled every 6, D/C Dilaudid

## 2023-06-02 NOTE — Assessment & Plan Note (Addendum)
WBC stable at 3.4 (3.9), hemoglobin stable at 9.6 (9.9), platelets stable at 34 (32).  Likely in setting of splenic sequestration with most likely underlying cirrhosis. Initial +DIC, s/p 1 u platelets with improvement. RUQ u/s ruled out hepatic venous thrombosis.  -Hold pharmacologic VTE prophylaxis -Appreciate heme recs

## 2023-06-02 NOTE — Discharge Instructions (Addendum)
Dear Alexander Mckenzie,   Thank you so much for allowing [place] to be part of your care!  You were admitted to [facility] for sudden abdominal pain. Your colon was inflamed and you received medications to help with the pain. You were found to have Hepatitis C and will follow up with the Hepatitis clinic outpatient for further management. You were also found to have liver cirrhosis, which is chronic scarring of the liver. The GI and Surgery saw you while you were here and did not recommend surgical intervention at the time. Your platelet level was low so you received a unit of platelets.    POST-HOSPITAL & CARE INSTRUCTIONS Please follow up with your GI doctors and ID specialists for hepatitis C treatment Please let PCP/Specialists know of any changes that were made.  Please see medications section of this packet for any medication changes.   DOCTOR'S APPOINTMENT & FOLLOW UP CARE INSTRUCTIONS  Future Appointments  Date Time Provider Department Center  06/03/2023 11:00 AM Donell Beers, FNP [facility] None  06/05/2023  3:00 PM Vu, Tonita Phoenix, MD [facility]  07/11/2023  2:00 PM Unk Lightning, PA [facility]   RETURN PRECAUTIONS: Worsening abdominal pain  Take care and be well!  Family Medicine Teaching Service  [place]  [facility]  [address] [place], [place] [address] [phone]

## 2023-06-02 NOTE — Progress Notes (Signed)
CVAD removed per protocol per MD order. Manual pressure applied for 3 mins. Vaseline gauze, gauze, and Tegaderm applied over insertion site. No bleeding or swelling noted. Instructed patient to remain in bed for thirty mins. Educated patient about S/S of infection and when to call MD; no heavy lifting or pressure on R side for 24 hours; keep dressing dry and intact for 24 hours. Pt verbalized comprehension.

## 2023-06-02 NOTE — Assessment & Plan Note (Addendum)
Bilateral upper and lower extremity edema without tenderness. Possibly related to underlying cirrhosis and low oncotic pressure. RUE DVT US negative.  Improving swelling today. -Holding pharmacologic VTE prophylaxis -Hold fluids for now

## 2023-06-02 NOTE — Plan of Care (Signed)
Spoke with mom Caesar Chestnut - discussed aptient's clinical course. Discussed importance of PCP f/u, GI and ID follow up. Will have social work set up before discharge.

## 2023-06-02 NOTE — Discharge Summary (Addendum)
Family Medicine Teaching [facility] Discharge Summary  Patient name: Alexander Mckenzie Medical record number: [id number] Date of birth: 1980-08-20 Age: 43 y.o. Gender: male Date of Admission: 05/29/2023  Date of Discharge: 06/02/2023 Admitting Physician: Levin Erp, MD  Primary Care Provider: Patient, No Pcp Per - scheduled to see [facility] Consultants: GI, Gen Surg, Heme, CCM  Indication for Hospitalization: Acute Colitis  DIC concern  Discharge Diagnoses/Problem List:  Principal Problem for Admission: Acute Colitis  DIC concern  Other Problems addressed during stay:  Principal Problem:   Acute Colitis  Cholelithiasis Active Problems:   Swelling of extremity   Elevated LFTs  Cirrhosis   Other pancytopenia (HCC)   Dehydration   Cirrhosis of liver (HCC)   Portal hypertension (HCC)   Hypoalbuminemia   +DIC Panel   Brief Hospital Course:  Alexander Mckenzie is a 43 y.o.male with a history of HTN, Hep C, IVDU who was admitted to the [facility] Medicine Teaching Service at [facility] for abdominal pain. His hospital course is detailed below:  Abdominal Pain CT with cholelithiasis, no cholecystitis, and acute colitis involving majority of colon. Pt was started on Zosyn q8h and bowel rest. General surgery and GI were consulted, no surgical intervention recommended at the time, not felt to be acute cholecystitis. Overall uncertain etiology, UDS positive for amphetamines. Patient tolerated CLD well. Patient tolerating regular diet, had a BM, with pain well controlled by time of discharge. Patient discharged with Augmentin for remaining abx course.   Transaminitis Cirrhotic changes with splenomegaly seen on CT.  AST/ALT downtrended 127/58 > 122/60 > 118/56. Hep C Ab+, plan for outpatient f/u w/ ID.   BLE swelling Bilateral upper and lower extremity edema without tenderness. Possibly related to underlying cirrhosis and low oncotic pressure. RUE DVT US negative.   Pancytopenia   DIC DIC lab panel positive but possibly false positive d/t liver cirrhosis. No signs of active bleeding or thrombotic events. No inpatient intervention per Heme. Suspected to be in setting of splenic sequestration with underlying cirrhosis/liver dysfunction. Pharmacologic VTE prophylaxis held throughout admission.  Prothrombin and INR downtrended during admission. Patient received 1 unit of platelets with improvement. RUQ u/s ruled out hepatic venous thrombosis.   Other chronic conditions were medically managed with home medications and formulary alternatives as necessary  PCP Follow-up Recommendations: Fu GI and ID recs CBC and CMP at follow up  Disposition: Home  Discharge Condition: Improved, stable  Discharge Exam:  Vitals:   06/02/23 1200 06/02/23 1217  BP: 110/69   Pulse: 62   Resp: (!) 8   Temp:  98 F (36.7 C)  SpO2: 97%    General: No acute distress. Resting comfortably in room. CV: Normal S1/S2. No extra heart sounds. Warm and well-perfused. Pulm: Breathing comfortably on room air. CTAB anteriorly. No increased WOB. Abd: Improving distention.  Mildly tender. Skin/Ext:  Warm, dry.  Improving upper and lower extremity swelling, nontender. Psych: Pleasant and appropriate.  Significant Procedures:  1/24: 1u platelets   Significant Labs and Imaging:  Recent Labs  Lab 05/31/23 1700 06/01/23 0552 06/02/23 0646  WBC 3.9* 3.4* 2.5*  HGB 9.9* 9.6* 10.0*  HCT 29.6* 28.6* 29.8*  PLT 32* 34* 39*   Recent Labs  Lab 06/01/23 0552 06/02/23 0646  NA 138 138  K 3.9 4.1  CL 111 110  CO2 24 24  GLUCOSE 85 111*  BUN 7 9  CREATININE 0.62 0.66  CALCIUM 7.5* 7.5*  ALKPHOS 109 109  AST 122* 118*  ALT 60* 56*  ALBUMIN <1.5* <1.5*   DIC lab: PT/INR/aPTT/Fib/D-dimer/Plt 23/2/42/131/3.57/29 > 25/2.2/42/170/5.94/27     Latest Ref Rng & Units 05/30/2023   10:20 AM  Hepatitis C RNA quantitative  HCV Quantitative >50 IU/mL 1,660,000   HCV Quantitative Log >1.70 log10  IU/mL 6.220      CT C/A/P: No aortic aneurysm or dissection. Moderate to marked changes of acute colitis involving the majority of the colon, most pronounced involving the ascending colon, hepatic flexure and transverse colon. Cholelithiasis with extensive pericholecystic fluid. Acute cholecystitis is less likely but not excluded. Small to moderate amount of free peritoneal fluid in the abdomen and pelvis. 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  Liver US: Patent portal veins with elevated velocities, suggesting portal hypertension. No portal vein thrombus. Cirrhosis with ascites.  Results/Tests Pending at Time of Discharge: Blood cx NG3d  Discharge Medications:  Allergies as of 06/02/2023       Reactions   Keflex [cephalexin] Rash        Medication List     TAKE these medications    amoxicillin-clavulanate 875-125 MG tablet Commonly known as: AUGMENTIN Take 1 tablet by mouth 2 (two) times daily starting 06/02/23.   Buprenorphine HCl-Naloxone HCl 8-2 MG Film Place 1 Film under the tongue 3 (three) times daily.        Discharge Instructions: Please refer to Patient Instructions section of EMR for full details.  Patient was counseled important signs and symptoms that should prompt return to medical care, changes in medications, dietary instructions, activity restrictions, and follow up appointments.   Follow-Up Appointments: Future Appointments  Date Time Provider Department Center  06/03/2023 11:00 AM Donell Beers, FNP [facility] None  06/05/2023  3:00 PM Raymondo Band, MD [facility]  07/11/2023  2:00 PM Unk Lightning, PA [facility]    Ivery Quale, MD 06/02/2023, 3:55 PM PGY-1, [facility]  Upper Level Addendum:  I have seen and evaluated this patient along with Dr. Threasa Beards and reviewed the above note, making necessary revisions as appropriate.  I agree with the medical decision making and physical exam as noted  above.  Levin Erp, MD PGY-3 [facility] Family Medicine Residency

## 2023-06-02 NOTE — TOC Transition Note (Signed)
Transition of Care [facility]) - Discharge Note   Patient Details  Name: Alexander Mckenzie MRN: [id number] Date of Birth: 09/03/1980  [facility]) CM/SW Contact:  Tom-Johnson, Hershal Coria, RN Phone Number: 06/02/2023, 4:06 PM   Clinical Narrative:     Patient is scheduled for discharge today.  Readmission Risk Assessment done. Hospital f/u and new patient establishment scheduled, Outpatient f/u, and discharge instructions on AVS. Prescriptions sent to [facility] pharmacy and patient will receive meds prior discharge. Mother, Arline Asp to transport at discharge.  No further TOC needs noted.              Final next level of care: Home/Self Care Barriers to Discharge: Barriers Resolved   Patient Goals and CMS Choice Patient states their goals for this hospitalization and ongoing recovery are:: To return home CMS Medicare.gov Compare Post Acute Care list provided to:: Patient Choice offered to / list presented to : Patient      Discharge Placement                Patient to be transferred to facility by: Mother Name of family member notified: Cindy    Discharge Plan and Services Additional resources added to the After Visit Summary for                  DME Arranged: N/A DME Agency: NA       HH Arranged: NA HH Agency: NA        Social Drivers of Health (SDOH) Interventions SDOH Screenings   Food Insecurity: No Food Insecurity (05/29/2023)  Housing: Low Risk  (05/29/2023)  Transportation Needs: No Transportation Needs (05/29/2023)  Utilities: Not At Risk (05/29/2023)  Social Connections: Unknown (09/09/2021)   Received from [facility]  Tobacco Use: High Risk (05/29/2023)     Readmission Risk Interventions    06/02/2023    3:50 PM  Readmission Risk Prevention Plan  Post Dischage Appt Complete  Medication Screening Complete  Transportation Screening Complete

## 2023-06-02 NOTE — Assessment & Plan Note (Addendum)
Cirrhotic changes with splenomegaly seen on CT.  AST/ALT downtrending 127/58 > 122/60 > 118/56. Hep C Ab+, plan for outpatient f/u w/ ID.  -AM CMP -Consider spiro/lasix as BP tolerates

## 2023-06-02 NOTE — Assessment & Plan Note (Addendum)
DIC lab panel positive but possibly false positive d/t liver cirrhosis. No signs of active bleeding or thrombotic events.  Prothrombin and INR downtrending. -Appreciate heme recs -CTM for signs of bleeding/clotting

## 2023-06-02 NOTE — Progress Notes (Signed)
Subjective/Chief Complaint: Afebrile Hemodynamically stable Pancytopenic LFT's slightly improved  Objective: Vital signs in last 24 hours: Temp:  [97.7 F (36.5 C)-98.3 F (36.8 C)] 97.7 F (36.5 C) (01/27 0727) Pulse Rate:  [53-67] 60 (01/27 0800) Resp:  [7-15] 10 (01/27 0800) BP: (99-127)/(56-71) 116/65 (01/27 0800) SpO2:  [95 %-98 %] 96 % (01/27 0800) Last BM Date : 06/01/23  Intake/Output from previous day: 01/26 0701 - 01/27 0700 In: 1288.9 [P.O.:720; I.V.:10; IV Piggyback:558.9] Out: 700 [Urine:700] Intake/Output this shift: Total I/O In: 480 [P.O.:480] Out: 400 [Urine:400]  Abd - soft, mildly distended, minimal tenderness on left  Lab Results:  Recent Labs    06/01/23 0552 06/02/23 0646  WBC 3.4* 2.5*  HGB 9.6* 10.0*  HCT 28.6* 29.8*  PLT 34* 39*   BMET Recent Labs    06/01/23 0552 06/02/23 0646  NA 138 138  K 3.9 4.1  CL 111 110  CO2 24 24  GLUCOSE 85 111*  BUN 7 9  CREATININE 0.62 0.66  CALCIUM 7.5* 7.5*   PT/INR Recent Labs    05/30/23 2230 06/02/23 0646  LABPROT 25.0* 21.9*  INR 2.2* 1.9*      Latest Ref Rng & Units 06/02/2023    6:46 AM 06/01/2023    5:52 AM 05/31/2023    4:23 AM  Hepatic Function  Total Protein 6.5 - 8.1 g/dL 5.0  5.2  4.2   Albumin 3.5 - 5.0 g/dL <1.6  <1.0  <9.6   AST 15 - 41 U/L 118  122  127   ALT 0 - 44 U/L 56  60  58   Alk Phosphatase 38 - 126 U/L 109  109  93   Total Bilirubin 0.0 - 1.2 mg/dL 1.4  1.6  2.4      Studies/Results: VAS [place] UPPER EXTREMITY VENOUS DUPLEX Result Date: 06/01/2023 UPPER VENOUS STUDY  Patient Name:  Alexander Mckenzie  Date of Exam:   06/01/2023 Medical Rec #: [id number]     Accession #:    [id number] Date of Birth: [birth year]     Patient Gender: M Patient Age:   43 years Exam Location:  [facility] Procedure:      VAS [place] UPPER EXTREMITY VENOUS DUPLEX Referring Phys: Lowanda Foster MCINTYRE --------------------------------------------------------------------------------  Indications:  Pain, and Swelling Limitations: Bandages and PICC line, unable to position arm for optimal imaging. Comparison Study: No prior study on file Performing Technologist: Sherren Kerns RVS  Examination Guidelines: A complete evaluation includes B-mode imaging, spectral Doppler, color Doppler, and power Doppler as needed of all accessible portions of each vessel. Bilateral testing is considered an integral part of a complete examination. Limited examinations for reoccurring indications may be performed as noted.  Right Findings: +----------+------------+---------+-----------+----------+---------------------+ RIGHT     CompressiblePhasicitySpontaneousProperties       Summary        +----------+------------+---------+-----------+----------+---------------------+ IJV           Full       Yes       Yes                                    +----------+------------+---------+-----------+----------+---------------------+ Subclavian    Full       Yes       Yes                                    +----------+------------+---------+-----------+----------+---------------------+  Axillary      Full       Yes       Yes                                    +----------+------------+---------+-----------+----------+---------------------+ Brachial      Full                                                        +----------+------------+---------+-----------+----------+---------------------+ Radial        Full                                                        +----------+------------+---------+-----------+----------+---------------------+ Ulnar         Full                                                        +----------+------------+---------+-----------+----------+---------------------+ Cephalic      Full                                                        +----------+------------+---------+-----------+----------+---------------------+ Basilic       Full                                    Unable to visualize                                                       portion from distal                                                      upper arm to proximal                                                      upper arm secondary                                                      to PICC and bandages  +----------+------------+---------+-----------+----------+---------------------+  Left Findings: +----------+------------+---------+-----------+----------+-------+ LEFT      CompressiblePhasicitySpontaneousPropertiesSummary +----------+------------+---------+-----------+----------+-------+  Subclavian               Yes       Yes                      +----------+------------+---------+-----------+----------+-------+  Summary:  Right: No evidence of deep vein thrombosis in the upper extremity. No evidence of superficial vein thrombosis in the upper extremity. Subcutaneous edema noted throughout the forearm. Tissue disturbance noted mid forearm, etiology unknown.  Left: No evidence of thrombosis in the subclavian.  *See table(s) above for measurements and observations.     Preliminary     Anti-infectives: Anti-infectives (From admission, onward)    Start     Dose/Rate Route Frequency Ordered Stop   05/30/23 0900  Ampicillin-Sulbactam (UNASYN) 3 g in sodium chloride 0.9 % 100 mL IVPB        3 g 200 mL/hr over 30 Minutes Intravenous Every 6 hours 05/30/23 0754     05/30/23 0800  metroNIDAZOLE (FLAGYL) IVPB 500 mg  Status:  Discontinued        500 mg 100 mL/hr over 60 Minutes Intravenous Every 12 hours 05/30/23 0754 05/30/23 0807   05/29/23 1715  piperacillin-tazobactam (ZOSYN) IVPB 3.375 g  Status:  Discontinued        3.375 g 12.5 mL/hr over 240 Minutes Intravenous Every 8 hours 05/29/23 1707 05/30/23 0754       Assessment/Plan: Abdominal pain Gallstones Transaminitis and hyperbilirubinemia Colitis Ascites Leukopenia and  thrombocytopenia Elevated D-dimer, low fibrinogen, elevated coagulation panel Cirrhosis   Clinical picture more consistent with colitis and not cholecystitis which seems to be now improving.  HIDA scan may be less sensitive due to his hepatic dysfunction from cirrhosis so would not pursue at this time.   He is a Programme researcher, broadcasting/film/video C cirrhotic by labs. MELD score 19   Defer to GI for evaluation of liver dysfunction Would consider hematology consult   No indications for any surgical intervention at this time.  The patient would be very high risk for perioperative mortality and complications due to his cirrhosis. We will sign off at this time but please call us if any new questions or concerns arise.  This required moderate level medical decision making.   LOS: 4 days    Lysle Rubens, MD 06/02/2023

## 2023-06-03 ENCOUNTER — Encounter: Payer: Self-pay | Admitting: Nurse Practitioner

## 2023-06-03 ENCOUNTER — Ambulatory Visit (INDEPENDENT_AMBULATORY_CARE_PROVIDER_SITE_OTHER): Payer: Self-pay | Admitting: Nurse Practitioner

## 2023-06-03 ENCOUNTER — Telehealth: Payer: Self-pay

## 2023-06-03 ENCOUNTER — Other Ambulatory Visit (HOSPITAL_COMMUNITY): Payer: Self-pay

## 2023-06-03 VITALS — BP 139/74 | HR 84 | Temp 98.1°F | Wt 287.0 lb

## 2023-06-03 DIAGNOSIS — R7989 Other specified abnormal findings of blood chemistry: Secondary | ICD-10-CM

## 2023-06-03 DIAGNOSIS — K529 Noninfective gastroenteritis and colitis, unspecified: Secondary | ICD-10-CM | POA: Insufficient documentation

## 2023-06-03 DIAGNOSIS — F17200 Nicotine dependence, unspecified, uncomplicated: Secondary | ICD-10-CM | POA: Insufficient documentation

## 2023-06-03 DIAGNOSIS — R600 Localized edema: Secondary | ICD-10-CM | POA: Insufficient documentation

## 2023-06-03 DIAGNOSIS — R609 Edema, unspecified: Secondary | ICD-10-CM | POA: Insufficient documentation

## 2023-06-03 DIAGNOSIS — Z131 Encounter for screening for diabetes mellitus: Secondary | ICD-10-CM | POA: Insufficient documentation

## 2023-06-03 DIAGNOSIS — Z09 Encounter for follow-up examination after completed treatment for conditions other than malignant neoplasm: Secondary | ICD-10-CM | POA: Insufficient documentation

## 2023-06-03 DIAGNOSIS — D65 Disseminated intravascular coagulation [defibrination syndrome]: Secondary | ICD-10-CM

## 2023-06-03 MED ORDER — FUROSEMIDE 20 MG PO TABS: 20.0000 mg | ORAL_TABLET | Freq: Every day | ORAL | 0 refills | Status: DC

## 2023-06-03 MED ORDER — POTASSIUM CHLORIDE CRYS ER 10 MEQ PO TBCR: 10.0000 meq | EXTENDED_RELEASE_TABLET | Freq: Every day | ORAL | 0 refills | Status: DC

## 2023-06-03 NOTE — Assessment & Plan Note (Signed)
Patient would like screening for diabetes Checking A1c

## 2023-06-03 NOTE — Assessment & Plan Note (Signed)
Need to avoid smoking cigarettes including risk of COPD, heart disease lung cancer discussed

## 2023-06-03 NOTE — Assessment & Plan Note (Signed)
Lab Results  Component Value Date   ALT 56 (H) 06/02/2023   AST 118 (H) 06/02/2023   ALKPHOS 109 06/02/2023   BILITOT 1.4 (H) 06/02/2023  Rechecking labs today Patient encouraged to keep upcoming appointment with the GI specialist Encouraged to avoid alcohol

## 2023-06-03 NOTE — Telephone Encounter (Signed)
[facility] Patient Advocate Encounter  Insurance verification completed.    The patient is uninsured and will need patient assistance for medication.  We can complete the application and will need to meet with the patient for signatures and income documentation.

## 2023-06-03 NOTE — Patient Instructions (Signed)
For the swelling in your lower extremities, be sure to elevate your legs when able, mind the salt intake, stay physically active and consider wearing compression stockings.    1. Elevated LFTs  Cirrhosis   2. Acute colitis  - CBC - CMP14+EGFR  3. Bilateral lower extremity edema (Primary)  - furosemide (LASIX) 20 MG tablet; Take 1 tablet (20 mg total) by mouth daily.  Dispense: 3 tablet; Refill: 0 - potassium chloride (KLOR-CON M) 10 MEQ tablet; Take 1 tablet (10 mEq total) by mouth daily.  Dispense: 3 tablet; Refill: 0    It is important that you exercise regularly at least 30 minutes 5 times a week as tolerated  Think about what you will eat, plan ahead. Choose " clean, green, fresh or frozen" over canned, processed or packaged foods which are more sugary, salty and fatty. 70 to 75% of food eaten should be vegetables and fruit. Three meals at set times with snacks allowed between meals, but they must be fruit or vegetables. Aim to eat over a 12 hour period , example 7 am to 7 pm, and STOP after  your last meal of the day. Drink water,generally about 64 ounces per day, no other drink is as healthy. Fruit juice is best enjoyed in a healthy way, by EATING the fruit.  Thanks for choosing [facility] we consider it a privelige to serve you.

## 2023-06-03 NOTE — Assessment & Plan Note (Signed)
Lab Results  Component Value Date   WBC 2.5 (L) 06/02/2023   HGB 10.0 (L) 06/02/2023   HCT 29.8 (L) 06/02/2023   MCV 98.7 06/02/2023   PLT 39 (L) 06/02/2023  Checking CBC today

## 2023-06-03 NOTE — Assessment & Plan Note (Signed)
His liver disease could be contributing to this Patient encouraged to wear compression socks, keep legs elevated and avoid salty foods - furosemide (LASIX) 20 MG tablet; Take 1 tablet (20 mg total) by mouth daily.  Dispense: 3 tablet; Refill: 0 - potassium chloride (KLOR-CON M) 10 MEQ tablet; Take 1 tablet (10 mEq total) by mouth daily.  Dispense: 3 tablet; Refill: 0

## 2023-06-03 NOTE — Assessment & Plan Note (Signed)
Hospital chart reviewed, including discharge summary Medications reconciled and reviewed with the patient in detail

## 2023-06-03 NOTE — Progress Notes (Signed)
New Patient Office Visit  Subjective:  Patient ID: Alexander Mckenzie, male    DOB: 30-Apr-1981  Age: 43 y.o. MRN: [id number]  CC:  Chief Complaint  Patient presents with   Hospitalization Follow-up   Establish Care    HPI Alexander Mckenzie is a 43 y.o. male  has a past medical history of Acute colitis (06/03/2023), Altered mental status (01/10/2020), Appendicitis (01/10/2020), Elevated LFTs  Cirrhosis (05/29/2023), Hepatitis C, Hypertension, Hypoalbuminemia (05/30/2023), Portal hypertension (HCC) (05/30/2023), SVT (supraventricular tachycardia) (HCC), and Tachycardia (11/15/2018).  Patient presents to establish care for his chronic medical condition and for hospital discharge follow-up.  Patient was on admission at the hospital from 05/29/2023 to 06/02/2023 for acute colitis, cirrhosis of liver, portal hypertension, hypoalbuminemia.  Patient was treated with Zosyn and and discharged with Augmentin.  He has a follow-up with infectious disease for hepatitis C, also has upcoming appointment with GI for his liver cirrhosis. Patient complains of bilateral lower extremity swelling also has pain on the left upper quadrant and left lower quadrants of the abdomen.  He denies fever, chills, chest pain, shortness of breath nausea, vomiting, constipation.   Smokes half pack of cigarettes daily , has history of substance abuse ,he denies current use of illicit drugs he is on Suboxone and [facility]  Stated that he was incarcerated for 5 years and released in December 2024, [facility] [place].       Past Medical History:  Diagnosis Date   Acute colitis 06/03/2023   Altered mental status 01/10/2020   Appendicitis 01/10/2020   Elevated LFTs  Cirrhosis 05/29/2023   Hepatitis C    Hypertension    Hypoalbuminemia 05/30/2023   Portal hypertension (HCC) 05/30/2023   SVT (supraventricular tachycardia) (HCC)    Tachycardia 11/15/2018    History reviewed. No  pertinent surgical history.  Family History  Problem Relation Age of Onset   Sleep apnea Mother    Diabetes Father    Lymphoma Sister     Social History   Socioeconomic History   Marital status: Single    Spouse name: Not on file   Number of children: 3   Years of education: Not on file   Highest education level: Not on file  Occupational History   Not on file  Tobacco Use   Smoking status: Every Day    Types: Cigarettes   Smokeless tobacco: Never  Substance and Sexual Activity   Alcohol use: No   Drug use: Not Currently    Types: Marijuana, Heroin   Sexual activity: Yes  Other Topics Concern   Not on file  Social History Narrative   Lives at [facility]    Social Drivers of Health   Financial Resource Strain: Not on file  Food Insecurity: No Food Insecurity (05/29/2023)   Hunger Vital Sign    Worried About Running Out of Food in the Last Year: Never true    Ran Out of Food in the Last Year: Never true  Transportation Needs: No Transportation Needs (05/29/2023)   PRAPARE - Administrator, Civil Service (Medical): No    Lack of Transportation (Non-Medical): No  Physical Activity: Not on file  Stress: Not on file  Social Connections: Unknown (09/09/2021)   Received from [facility]   Social Network    Social Network: Not on file  Intimate Partner Violence: Not At Risk (05/29/2023)   Humiliation, Afraid, Rape, and Kick questionnaire  Fear of Current or Ex-Partner: No    Emotionally Abused: No    Physically Abused: No    Sexually Abused: No    ROS Review of Systems  Constitutional:  Negative for appetite change, chills, fatigue and fever.  HENT:  Negative for congestion, postnasal drip, rhinorrhea and sneezing.   Respiratory:  Negative for cough, shortness of breath and wheezing.   Cardiovascular:  Positive for leg swelling. Negative for chest pain and palpitations.  Gastrointestinal:  Positive for abdominal pain. Negative for  constipation, nausea and vomiting.  Genitourinary:  Negative for difficulty urinating, dysuria, flank pain and frequency.  Musculoskeletal:  Negative for arthralgias, back pain, joint swelling and myalgias.  Skin:  Negative for color change, pallor, rash and wound.  Neurological:  Negative for dizziness, facial asymmetry, weakness, numbness and headaches.  Psychiatric/Behavioral:  Negative for behavioral problems, confusion, self-injury and suicidal ideas.     Objective:   Today's Vitals: BP 139/74   Pulse 84   Temp 98.1 F (36.7 C)   Wt 287 lb (130.2 kg)   SpO2 100%   BMI 34.93 kg/m   Physical Exam Vitals and nursing note reviewed.  Constitutional:      General: He is not in acute distress.    Appearance: Normal appearance. He is obese. He is not ill-appearing, toxic-appearing or diaphoretic.  HENT:     Mouth/Throat:     Mouth: Mucous membranes are moist.     Pharynx: Oropharynx is clear. No oropharyngeal exudate or posterior oropharyngeal erythema.  Eyes:     General: No scleral icterus.       Right eye: No discharge.        Left eye: No discharge.     Extraocular Movements: Extraocular movements intact.     Conjunctiva/sclera: Conjunctivae normal.  Cardiovascular:     Rate and Rhythm: Normal rate and regular rhythm.     Pulses: Normal pulses.     Heart sounds: Normal heart sounds. No murmur heard.    No friction rub. No gallop.  Pulmonary:     Effort: Pulmonary effort is normal. No respiratory distress.     Breath sounds: Normal breath sounds. No stridor. No wheezing, rhonchi or rales.  Chest:     Chest wall: No tenderness.  Abdominal:     General: There is no distension.     Palpations: Abdomen is soft.     Tenderness: There is abdominal tenderness. There is no right CVA tenderness, left CVA tenderness or guarding.  Musculoskeletal:        General: No swelling, tenderness, deformity or signs of injury.     Right lower leg: Edema present.     Left lower leg:  Edema present.     Comments: Non pitting edema BLE noted.  No redness  noted, skin is warm and dry  Skin:    General: Skin is warm and dry.     Capillary Refill: Capillary refill takes less than 2 seconds.     Coloration: Skin is not jaundiced or pale.     Findings: No bruising, erythema or lesion.  Neurological:     Mental Status: He is alert and oriented to person, place, and time.     Motor: No weakness.     Coordination: Coordination normal.     Gait: Gait normal.  Psychiatric:        Mood and Affect: Mood normal.        Behavior: Behavior normal.        Thought  Content: Thought content normal.        Judgment: Judgment normal.     Assessment & Plan:   Problem List Items Addressed This Visit       Digestive   Acute colitis - Primary   Relevant Orders   CBC   CMP14+EGFR     Hematopoietic and Hemostatic   +DIC Panel   Lab Results  Component Value Date   WBC 2.5 (L) 06/02/2023   HGB 10.0 (L) 06/02/2023   HCT 29.8 (L) 06/02/2023   MCV 98.7 06/02/2023   PLT 39 (L) 06/02/2023  Checking CBC today        Other   Elevated LFTs  Cirrhosis   Lab Results  Component Value Date   ALT 56 (H) 06/02/2023   AST 118 (H) 06/02/2023   ALKPHOS 109 06/02/2023   BILITOT 1.4 (H) 06/02/2023  Rechecking labs today Patient encouraged to keep upcoming appointment with the GI specialist Encouraged to avoid alcohol      Bilateral lower extremity edema   His liver disease could be contributing to this Patient encouraged to wear compression socks, keep legs elevated and avoid salty foods - furosemide (LASIX) 20 MG tablet; Take 1 tablet (20 mg total) by mouth daily.  Dispense: 3 tablet; Refill: 0 - potassium chloride (KLOR-CON M) 10 MEQ tablet; Take 1 tablet (10 mEq total) by mouth daily.  Dispense: 3 tablet; Refill: 0       Relevant Medications   furosemide (LASIX) 20 MG tablet   potassium chloride (KLOR-CON M) 10 MEQ tablet   Tobacco use disorder   Need to avoid smoking  cigarettes including risk of COPD, heart disease lung cancer discussed      Screening for diabetes mellitus   Patient would like screening for diabetes Checking A1c      Relevant Orders   Hemoglobin A1c    Outpatient Encounter Medications as of 06/03/2023  Medication Sig   amoxicillin-clavulanate (AUGMENTIN) 875-125 MG tablet Take 1 tablet by mouth 2 (two) times daily starting 06/02/23.   furosemide (LASIX) 20 MG tablet Take 1 tablet (20 mg total) by mouth daily.   potassium chloride (KLOR-CON M) 10 MEQ tablet Take 1 tablet (10 mEq total) by mouth daily.   Simethicone (GAS FREE EXTRA STRENGTH PO) Take by mouth.   Buprenorphine HCl-Naloxone HCl 8-2 MG FILM Place 1 Film under the tongue 3 (three) times daily. (Patient not taking: Reported on 05/29/2023)   No facility-administered encounter medications on file as of 06/03/2023.    Follow-up: Return in about 3 months (around 09/01/2023) for Cirrhosis .   Donell Beers, FNP

## 2023-06-04 ENCOUNTER — Telehealth: Payer: Self-pay | Admitting: Hematology and Oncology

## 2023-06-04 ENCOUNTER — Other Ambulatory Visit: Payer: Self-pay | Admitting: Nurse Practitioner

## 2023-06-04 DIAGNOSIS — D696 Thrombocytopenia, unspecified: Secondary | ICD-10-CM

## 2023-06-04 LAB — CMP14+EGFR
ALT: 63 [IU]/L — ABNORMAL HIGH (ref 0–44)
AST: 147 [IU]/L — ABNORMAL HIGH (ref 0–40)
Albumin: 2.2 g/dL — ABNORMAL LOW (ref 4.1–5.1)
Alkaline Phosphatase: 201 [IU]/L — ABNORMAL HIGH (ref 44–121)
BUN/Creatinine Ratio: 13 (ref 9–20)
BUN: 7 mg/dL (ref 6–24)
Bilirubin Total: 1.4 mg/dL — ABNORMAL HIGH (ref 0.0–1.2)
CO2: 22 mmol/L (ref 20–29)
Calcium: 7.7 mg/dL — ABNORMAL LOW (ref 8.7–10.2)
Chloride: 109 mmol/L — ABNORMAL HIGH (ref 96–106)
Creatinine, Ser: 0.54 mg/dL — ABNORMAL LOW (ref 0.76–1.27)
Globulin, Total: 3.5 g/dL (ref 1.5–4.5)
Glucose: 128 mg/dL — ABNORMAL HIGH (ref 70–99)
Potassium: 4.3 mmol/L (ref 3.5–5.2)
Sodium: 140 mmol/L (ref 134–144)
Total Protein: 5.7 g/dL — ABNORMAL LOW (ref 6.0–8.5)
eGFR: 128 mL/min/{1.73_m2} (ref >59)

## 2023-06-04 LAB — CBC
Hematocrit: 32.4 % — ABNORMAL LOW (ref 37.5–51.0)
Hemoglobin: 11.3 g/dL — ABNORMAL LOW (ref 13.0–17.7)
MCH: 33.3 pg — ABNORMAL HIGH (ref 26.6–33.0)
MCHC: 34.9 g/dL (ref 31.5–35.7)
MCV: 96 fL (ref 79–97)
Platelets: 65 10*3/uL — CL (ref 150–450)
RBC: 3.39 x10E6/uL — ABNORMAL LOW (ref 4.14–5.80)
RDW: 13.1 % (ref 11.6–15.4)
WBC: 3.9 10*3/uL (ref 3.4–10.8)

## 2023-06-04 LAB — CULTURE, BLOOD (ROUTINE X 2)
Culture: NO GROWTH
Culture: NO GROWTH

## 2023-06-04 LAB — HEMOGLOBIN A1C
Est. average glucose Bld gHb Est-mCnc: 88 mg/dL
Hgb A1c MFr Bld: 4.7 % — ABNORMAL LOW (ref 4.8–5.6)

## 2023-06-04 NOTE — Telephone Encounter (Signed)
Patient is aware of scheduled appointment times/dates, is an established follow up since patient was already seen in the hospital

## 2023-06-05 ENCOUNTER — Ambulatory Visit (INDEPENDENT_AMBULATORY_CARE_PROVIDER_SITE_OTHER): Payer: Self-pay | Admitting: Internal Medicine

## 2023-06-05 ENCOUNTER — Other Ambulatory Visit: Payer: Self-pay

## 2023-06-05 ENCOUNTER — Other Ambulatory Visit (HOSPITAL_COMMUNITY): Payer: Self-pay

## 2023-06-05 ENCOUNTER — Encounter: Payer: Self-pay | Admitting: Internal Medicine

## 2023-06-05 VITALS — BP 155/88 | HR 108 | Temp 99.3°F | Ht 76.0 in | Wt 280.0 lb

## 2023-06-05 DIAGNOSIS — Z1159 Encounter for screening for other viral diseases: Secondary | ICD-10-CM

## 2023-06-05 DIAGNOSIS — B182 Chronic viral hepatitis C: Secondary | ICD-10-CM

## 2023-06-05 DIAGNOSIS — K7460 Unspecified cirrhosis of liver: Secondary | ICD-10-CM

## 2023-06-05 LAB — DRUG PROFILE, UR, 9 DRUGS (LABCORP)
Amphetamines, Urine: POSITIVE ng/mL — AB
Barbiturate, Ur: NEGATIVE ng/mL
Benzodiazepine Quant, Ur: NEGATIVE ng/mL
Cannabinoid Quant, Ur: NEGATIVE ng/mL
Cocaine (Metab.): NEGATIVE ng/mL
Methadone Screen, Urine: NEGATIVE ng/mL
Opiate Quant, Ur: NEGATIVE ng/mL
Phencyclidine, Ur: NEGATIVE ng/mL
Propoxyphene, Urine: NEGATIVE ng/mL

## 2023-06-05 MED ORDER — SOFOSBUVIR-VELPATASVIR 400-100 MG PO TABS: 1.0000 | ORAL_TABLET | Freq: Every day | ORAL | 5 refills | Status: AC

## 2023-06-05 NOTE — Patient Instructions (Signed)
Will have pharmacy review your case  Plan epclusa medication for 6 months   Gi referral to fully evaluate cirrhosis complication  Follow up with pharmacy if they get your treatment through  Please see me again though in 6-8 weeks to make sure nothing falls through the crack    Labs today to evaluate hepatitis b immunity and see if vaccination needed   Close household contacts should test for hep c. Do not share personal hygiene equipments with anyone

## 2023-06-05 NOTE — Progress Notes (Signed)
[facility]  Reason for Consult:hep c Referring Provider: Edwin Dada    Patient Active Problem List   Diagnosis Date Noted   Acute colitis 06/03/2023   Edema 06/03/2023   Bilateral lower extremity edema 06/03/2023   Tobacco use disorder 06/03/2023   Screening for diabetes mellitus 06/03/2023   Hospital discharge follow-up 06/03/2023   Dehydration 05/30/2023   Cirrhosis of liver (HCC) 05/30/2023   Portal hypertension (HCC) 05/30/2023   Hypoalbuminemia 05/30/2023   +DIC Panel 05/30/2023   Acute Colitis  Cholelithiasis 05/29/2023   Swelling of extremity 05/29/2023   Elevated LFTs  Cirrhosis 05/29/2023   Other pancytopenia (HCC) 05/29/2023      HPI: Alexander Mckenzie is a 43 y.o. male referred here for hep c management    Timing of diagnosis: 2021 first dx'ed never treated (was in jail for drug trafficking   Risk: hx ivdu/tatoos (first tatoos/ivdu around early 2010s); no prior blood transfusion  Cirrhosis finding: ascites/thrombocytopenia, cirrhotic features on imaging of the liver.    Extra gi manifestation: No abnormal skin rash, fatigue, diffuse joint pain/swelling, hx kidney disease   Drank etoh in abundance at age 20-19 but since then up to several years ago drink heavily still on the weekend     We reviewed: Natural history of hep c and its complications available treatment options for hepatitis C Other factors potentially worsening liver disease, including alcohol use; obesity; diabetes mellitus, and viral coinfection We discussed potential medications that can contribute to liver inflammation like acetaminophen, and to avoiding excessive amount (more than 3 gram daily use) acetaminophen    He was recently hospitalized for colitis in [place] and given a short abx course; no finishing augmentin  No paracentesis done    He has leg swellings and some abdominal distension No confusion/gib/rash     Review  of Systems: ROS All other ros negative       Past Medical History:  Diagnosis Date   Acute colitis 06/03/2023   Altered mental status 01/10/2020   Appendicitis 01/10/2020   Elevated LFTs  Cirrhosis 05/29/2023   Hepatitis C    Hypertension    Hypoalbuminemia 05/30/2023   Portal hypertension (HCC) 05/30/2023   SVT (supraventricular tachycardia) (HCC)    Tachycardia 11/15/2018    Social History   Tobacco Use   Smoking status: Every Day    Types: Cigarettes   Smokeless tobacco: Never  Substance Use Topics   Alcohol use: No   Drug use: Not Currently    Types: Marijuana, Heroin    Family History  Problem Relation Age of Onset   Sleep apnea Mother    Diabetes Father    Lymphoma Sister     Allergies  Allergen Reactions   Keflex [Cephalexin] Rash    OBJECTIVE: Vitals:   06/05/23 1519 06/05/23 1543  BP: (!) 157/113 (!) 155/88  Pulse: (!) 108   Temp: 99.3 F (37.4 C)   TempSrc: Oral   SpO2: 98%   Weight: 280 lb (127 kg)   Height: 6\' 4"  (1.93 m)    There is no height or weight on file to calculate BMI.   Physical Exam General/constitutional: no distress, pleasant HEENT: Normocephalic, PER, Conj Clear, EOMI, Oropharynx clear Neck supple CV: rrr no mrg Lungs: clear to auscultation, normal respiratory effort Abd: Soft, Nontender, slight distension Ext: 1+bilateral lower ext edema Skin: multiple tatoos Neuro: nonfocal MSK: no peripheral joint swelling/tenderness/warmth; back spines nontender   Lab: Lab Results  Component Value Date   WBC 3.9 06/03/2023   HGB 11.3 (L) 06/03/2023   HCT 32.4 (L) 06/03/2023   MCV 96 06/03/2023   PLT 65 (LL) 06/03/2023   Last metabolic panel Lab Results  Component Value Date   GLUCOSE 128 (H) 06/03/2023   NA 140 06/03/2023   K 4.3 06/03/2023   CL 109 (H) 06/03/2023   CO2 22 06/03/2023   BUN 7 06/03/2023   CREATININE 0.54 (L) 06/03/2023   EGFR 128 06/03/2023   CALCIUM 7.7 (L) 06/03/2023   PROT 5.7 (L)  06/03/2023   ALBUMIN 2.2 (L) 06/03/2023   LABGLOB 3.5 06/03/2023   BILITOT 1.4 (H) 06/03/2023   ALKPHOS 201 (H) 06/03/2023   AST 147 (H) 06/03/2023   ALT 63 (H) 06/03/2023   ANIONGAP 4 (L) 06/02/2023    06/03/23 fib 4 score -- 11.68; apri score 5.8   Microbiology:  Serology: 05/2023 hcv rna 1.7 million; hiv screen negative; acute hep b serology negative; rpr negative  Imaging: Reviewed  05/29/23 chest abd pelv cta 1. No aortic aneurysm or dissection. 2. Moderate to marked changes of acute colitis involving the majority of the colon, most pronounced involving the ascending colon, hepatic flexure and transverse colon. This could be infectious or inflammatory in nature. 3. Cholelithiasis with extensive pericholecystic fluid. The fluid is most likely due to the diffuse colitis. Acute cholecystitis is less likely but not excluded. 4. Small to moderate amount of free peritoneal fluid in the abdomen and pelvis. 5. 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 6. Multiple mildly enlarged upper abdominal lymph nodes, primarily mesenteric and gastrohepatic ligament nodes as well as a right inguinal node. These are nonspecific and most likely reactive. 7. Possible undescended testis in the inferior right inguinal canal. A moderate sized inferior inguinal hernia containing low to medium density fluid is less likely. 8. Atheromatous coronary artery calcifications.     05/30/23 liver u/s with doppler FINDINGS: Liver: Heterogeneous hepatic echogenicity. Subtle capsular nodularity. No focal lesion, mass or intrahepatic biliary ductal dilatation.   Main Portal Vein size: 1.4 cm   Portal Vein Velocities   Main Prox:  61.5 cm/sec   Main Mid: 69.3 cm/sec   Main Dist:  73.5 cm/sec Right: 62 cm/sec Left: 50.4 cm/sec   Hepatic Vein Velocities   Right:  53 cm/sec   Middle:  33 cm/sec   Left:  35 cm/sec   IVC: Not evaluated.   Hepatic Artery Velocity:  114.5 cm/sec    Splenic Vein Velocity:  18.9 cm/sec   Spleen: 17.4 cm x 4 cm x 5.5 cm with a total volume of 267 cm^3 (411 cm^3 is upper limit normal). This likely underestimates splenic volume due to caliper placement.   Portal Vein Occlusion/Thrombus: No   Splenic Vein Occlusion/Thrombus: No   Ascites: Present.   Varices: None   Gallstones are noted.  Thickened gallbladder wall.   IMPRESSION: 1. Patent portal veins with elevated velocities, suggesting portal hypertension. No portal vein thrombus. 2. Cirrhosis with ascites. 3. Splenomegaly on yesterday's CT is not demonstrated on the current exam, likely due to caliper placement.   Assessment/plan: Problem List Items Addressed This Visit     Cirrhosis of liver (HCC)   Relevant Orders   Ambulatory referral to Gastroenterology   Other Visit Diagnoses       Chronic hepatitis C without hepatic coma (HCC)    -  Primary   Relevant Orders   Hepatitis C genotype     Need for  hepatitis B screening test       Relevant Orders   Hepatitis B surface antibody,quantitative   Hepatitis B Core Antibody, total   Hepatitis B DNA, ultraquantitative, PCR         #chronic hep c #decompensated cirrhosis with ascites     Prior treatment: none prior to 05/2023 GT: testing ordered today Evidence of cirrhosis: yes on imaging, labs Interested in treatment yes Potential DDI: none   -gi referral placed for cirrhosis management -Labs: hep b serology -Imaging: no further need at this time -follow up: 6 weeks with me; pharmacy to review epclusa eligibility and financial assistance -Meds planned: 24 weeks epclusa  -discussed natural progression of hep c, transmission (avoid sharing personal hygiene equipment) -discussed avoid toxin like etoh and excessive acetamaminphen (no more than 2 gram a day) -discussed healthy life style and good glucose control -discussed avoiding eating raw sea food -discussed we can treat hep c but can be  reinfected -discussed hepatitis coinfection and vaccination     #colitis Recent admission for abd pain without diarrhea -- colitis on imaging and given short course abx which he is finishing    #lft elevation -- likely hep c #thrombocytopenia -- likely hep c    Follow-up: Return in about 6 weeks (around 07/17/2023).  Raymondo Band, MD [facility] 06/05/2023, 3:16 PM

## 2023-06-05 NOTE — Addendum Note (Signed)
Addended by: Rutha Bouchard T on: 06/05/2023 04:33 PM   Modules accepted: Orders

## 2023-06-08 ENCOUNTER — Telehealth: Payer: Self-pay | Admitting: Family Medicine

## 2023-06-08 NOTE — Progress Notes (Signed)
 Pt did not show for visit. DWB

## 2023-06-09 ENCOUNTER — Inpatient Hospital Stay: Payer: Self-pay | Admitting: Family Medicine

## 2023-06-09 ENCOUNTER — Telehealth: Payer: Self-pay | Admitting: Physician Assistant

## 2023-06-09 ENCOUNTER — Emergency Department (HOSPITAL_COMMUNITY): Admission: EM | Admit: 2023-06-09 | Discharge: 2023-06-09 | Discharge status: Against Medical Advice | Payer: MEDICAID

## 2023-06-09 DIAGNOSIS — N5089 Other specified disorders of the male genital organs: Secondary | ICD-10-CM

## 2023-06-09 DIAGNOSIS — K7469 Other cirrhosis of liver: Secondary | ICD-10-CM

## 2023-06-09 NOTE — ED Notes (Signed)
Pt called for vitals and triage no response

## 2023-06-09 NOTE — ED Notes (Signed)
Called pt x2 to tirage, no answer.Marland KitchenMarland KitchenMarland KitchenKM

## 2023-06-09 NOTE — Patient Instructions (Addendum)
RECOMMEND IMMEDIATE EVALUATION IN THE EMERGENCY DEPARTMENT  [name], thank you for joining [name], PA-C for today's virtual visit.  While this provider is not your primary care provider (PCP), if your PCP is located in our provider database this encounter information will be shared with them immediately following your visit.   A [facility] MyChart account gives you access to today's visit and all your visits, tests, and labs performed at [facility] " click here if you don't have a [facility] MyChart account or go to [email or web address]  Consent: (Patient) Alexander Mckenzie provided verbal consent for this virtual visit at the beginning of the encounter.  Current Medications:  Current Outpatient Medications:    Buprenorphine HCl-Naloxone HCl 8-2 MG FILM, Place 1 Film under the tongue 3 (three) times daily., Disp: , Rfl:    furosemide (LASIX) 20 MG tablet, Take 1 tablet (20 mg total) by mouth daily. (Patient not taking: Reported on 06/05/2023), Disp: 3 tablet, Rfl: 0   potassium chloride (KLOR-CON M) 10 MEQ tablet, Take 1 tablet (10 mEq total) by mouth daily. (Patient not taking: Reported on 06/05/2023), Disp: 3 tablet, Rfl: 0   Simethicone (GAS FREE EXTRA STRENGTH PO), Take by mouth., Disp: , Rfl:    Sofosbuvir-Velpatasvir (EPCLUSA) 400-100 MG TABS, Take 1 tablet by mouth daily. Take 1 tablet by mouth daily., Disp: 28 tablet, Rfl: 5   Medications ordered in this encounter:  No orders of the defined types were placed in this encounter.    *If you need refills on other medications prior to your next appointment, please contact your pharmacy*  Follow-Up: Call back or seek an in-person evaluation if the symptoms worsen or if the condition fails to improve as anticipated.  [facility] [phone]  Other Instructions  RECOMMEND IMMEDIATE EVALUATION IN THE EMERGENCY DEPARTMENT  If you have been instructed to have an in-person evaluation today at a  local Urgent Care facility, please use the link below. It will take you to a list of all of our available [facility], including address, phone number and hours of operation. Please do not delay care.  [facility]  If you or a family member do not have a primary care provider, use the link below to schedule a visit and establish care. When you choose a [facility] primary care physician or advanced practice provider, you gain a long-term partner in health. Find a Primary Care Provider  Learn more about [facility]'s in-office and virtual care options: [facility] - Get Care Now

## 2023-06-09 NOTE — Progress Notes (Signed)
Virtual Visit Consent   Alexander Mckenzie, you are scheduled for a virtual visit with a [facility] provider today. Just as with appointments in the office, your consent must be obtained to participate. Your consent will be active for this visit and any virtual visit you may have with one of our providers in the next 365 days. If you have a MyChart account, a copy of this consent can be sent to you electronically.  As this is a virtual visit, video technology does not allow for your provider to perform a traditional examination. This may limit your provider's ability to fully assess your condition. If your provider identifies any concerns that need to be evaluated in person or the need to arrange testing (such as labs, EKG, etc.), we will make arrangements to do so. Although advances in technology are sophisticated, we cannot ensure that it will always work on either your end or our end. If the connection with a video visit is poor, the visit may have to be switched to a telephone visit. With either a video or telephone visit, we are not always able to ensure that we have a secure connection.  By engaging in this virtual visit, you consent to the provision of healthcare and authorize for your insurance to be billed (if applicable) for the services provided during this visit. Depending on your insurance coverage, you may receive a charge related to this service.  I need to obtain your verbal consent now. Are you willing to proceed with your visit today? Alexander Mckenzie has provided verbal consent on 06/09/2023 for a virtual visit (video or telephone). [name], PA-C  Date: 06/09/2023 7:35 PM  Virtual Visit via Video Note   I, Alexander Mckenzie, connected with  Alexander Mckenzie  ([id number], 05-Jan-1981) on 06/09/23 at  7:15 PM EST by a video-enabled telemedicine application and verified that I am speaking with the correct person using two identifiers.  Location: Patient: Virtual Visit Location Patient:  Home Provider: Virtual Visit Location Provider: Home Office   I discussed the limitations of evaluation and management by telemedicine and the availability of in person appointments. The patient expressed understanding and agreed to proceed.    History of Present Illness: Alexander Mckenzie is a 43 y.o. who identifies as a male who was assigned male at birth, with a history of chronic hepatitis and cirrhosis of the liver, presents with a swollen scrotum and a severe rash on his scrotum. He describes the swelling as so severe that he can't feel his testicles and the rash as similar to a sunburn. He also reports fluid accumulation in the back of his thighs. He was recently hospitalized and upon discharge, he had abdominal bloating which has since improved slightly. He was prescribed Lasix and potassium during his hospital stay, but he has run out of Lasix. He has an upcoming appointment with a gastroenterologist and a hematologist.  He is currently residing in a halfway house.   Problems:  Patient Active Problem List   Diagnosis Date Noted   Acute colitis 06/03/2023   Edema 06/03/2023   Bilateral lower extremity edema 06/03/2023   Tobacco use disorder 06/03/2023   Screening for diabetes mellitus 06/03/2023   Hospital discharge follow-up 06/03/2023   Dehydration 05/30/2023   Cirrhosis of liver (HCC) 05/30/2023   Portal hypertension (HCC) 05/30/2023   Hypoalbuminemia 05/30/2023   +DIC Panel 05/30/2023   Acute Colitis  Cholelithiasis 05/29/2023   Swelling of extremity 05/29/2023   Elevated LFTs  Cirrhosis 05/29/2023  Other pancytopenia (HCC) 05/29/2023    Allergies:  Allergies  Allergen Reactions   Keflex [Cephalexin] Rash   Medications:  Current Outpatient Medications:    Buprenorphine HCl-Naloxone HCl 8-2 MG FILM, Place 1 Film under the tongue 3 (three) times daily., Disp: , Rfl:    furosemide (LASIX) 20 MG tablet, Take 1 tablet (20 mg total) by mouth daily. (Patient not taking:  Reported on 06/05/2023), Disp: 3 tablet, Rfl: 0   potassium chloride (KLOR-CON M) 10 MEQ tablet, Take 1 tablet (10 mEq total) by mouth daily. (Patient not taking: Reported on 06/05/2023), Disp: 3 tablet, Rfl: 0   Simethicone (GAS FREE EXTRA STRENGTH PO), Take by mouth., Disp: , Rfl:    Sofosbuvir-Velpatasvir (EPCLUSA) 400-100 MG TABS, Take 1 tablet by mouth daily. Take 1 tablet by mouth daily., Disp: 28 tablet, Rfl: 5  Observations/Objective: Patient is well-developed, well-nourished in no acute distress.  Resting comfortably  at home.  Head is normocephalic, atraumatic.  No labored breathing.  Speech is clear and coherent with logical content.  Patient is alert and oriented at baseline.    Assessment and Plan: 1. Other cirrhosis of liver (HCC) (Primary)  2. Scrotal swelling   Recent hospitalization for ascites. Currently experiencing increased abdominal distension and significant scrotal swelling suggestive of scrotal edema secondary to ascites. No current diuretic therapy.  RECOMMEND IMMEDIATE EVALUATION IN THE EMERGENCY DEPARTMENT   Follow Up Instructions: I discussed the assessment and treatment plan with the patient. The patient was provided an opportunity to ask questions and all were answered. The patient agreed with the plan and demonstrated an understanding of the instructions.  A copy of instructions were sent to the patient via MyChart unless otherwise noted below.     The patient was advised to call back or seek an in-person evaluation if the symptoms worsen or if the condition fails to improve as anticipated.    Alexander Knudsen Mayers, PA-C

## 2023-06-10 ENCOUNTER — Other Ambulatory Visit: Payer: Self-pay | Admitting: Nurse Practitioner

## 2023-06-10 DIAGNOSIS — R600 Localized edema: Secondary | ICD-10-CM

## 2023-06-10 MED ORDER — POTASSIUM CHLORIDE CRYS ER 10 MEQ PO TBCR: 10.0000 meq | EXTENDED_RELEASE_TABLET | Freq: Every day | ORAL | 0 refills | Status: AC

## 2023-06-10 MED ORDER — FUROSEMIDE 20 MG PO TABS: 20.0000 mg | ORAL_TABLET | Freq: Every day | ORAL | 0 refills | Status: AC

## 2023-06-11 LAB — HEPATITIS B DNA, ULTRAQUANTITATIVE, PCR
Hepatitis B DNA: NOT DETECTED [IU]/mL
Hepatitis B virus DNA: NOT DETECTED {Log_IU}/mL

## 2023-06-11 LAB — HEPATITIS C GENOTYPE

## 2023-06-11 LAB — HEPATITIS B SURFACE ANTIBODY, QUANTITATIVE: Hep B S AB Quant (Post): 178 m[IU]/mL (ref >=10)

## 2023-06-11 LAB — HEPATITIS B CORE ANTIBODY, TOTAL: Hep B Core Total Ab: NONREACTIVE

## 2023-06-16 ENCOUNTER — Other Ambulatory Visit: Payer: Self-pay

## 2023-06-17 ENCOUNTER — Other Ambulatory Visit (HOSPITAL_COMMUNITY): Payer: Self-pay

## 2023-06-24 ENCOUNTER — Other Ambulatory Visit: Payer: Self-pay | Admitting: *Deleted

## 2023-06-24 DIAGNOSIS — D61818 Other pancytopenia: Secondary | ICD-10-CM

## 2023-06-25 ENCOUNTER — Telehealth: Payer: Self-pay | Admitting: Hematology and Oncology

## 2023-06-25 ENCOUNTER — Inpatient Hospital Stay: Payer: Self-pay | Admitting: Hematology and Oncology

## 2023-06-25 ENCOUNTER — Inpatient Hospital Stay: Payer: Self-pay | Attending: Nurse Practitioner

## 2023-06-25 NOTE — Assessment & Plan Note (Deleted)
 Hospitalization 05/29/2023-06/02/2023: Acute colitis, pancytopenia, cirrhosis, concern for DIC 04/21/2023 revealed a platelet count of 52, hemoglobin of 11.6 and a white count of 4.3. 05/29/2023: Platelet count 48, hemoglobin 12.4, WBC 2.7 05/31/2023: Platelet count 25 (after transfusion on 05/30/2023), hemoglobin 7.8, WBC 3.5 05/26/2023: Platelets 65, hemoglobin 11.3, WBC 3.9 06/25/2023:  Differential:   Elevated coagulation studies: Could not rule out DIC because of underlying liver dysfunction Childs C cirrhosis oral vitamin K Clinical suspicion for colitis

## 2023-06-25 NOTE — Telephone Encounter (Signed)
 Scheduled appointments per 2/19 scheduling message. Left the patient a voicemail with the appointment details.

## 2023-06-26 ENCOUNTER — Other Ambulatory Visit (HOSPITAL_COMMUNITY): Payer: Self-pay

## 2023-06-30 ENCOUNTER — Other Ambulatory Visit (HOSPITAL_COMMUNITY): Payer: Self-pay

## 2023-06-30 ENCOUNTER — Telehealth: Payer: Self-pay

## 2023-06-30 NOTE — Telephone Encounter (Signed)
 [facility] Patient Advocate Encounter  Completed and sent [employer] application for Epclusa for this patient who is uninsured.    Patient assistance phone number for follow up is [phone].   This encounter will be updated until final determination.   Clearance Coots, CPhT Specialty Pharmacy Patient [facility] Phone: [phone] Fax:  [phone]

## 2023-07-01 ENCOUNTER — Telehealth: Payer: Self-pay

## 2023-07-01 NOTE — Telephone Encounter (Signed)
 Called Alexander Mckenzie to counsel on Epclusa for Hepatitis C treatment. He did not answer but I left a HIPAA compliant voicemail prompting him to call us back.

## 2023-07-01 NOTE — Telephone Encounter (Signed)
 [facility] Patient Advocate Encounter  Completed and sent [employer] application for Epclusa for this patient who is uninsured.    Patient is approved 06/30/23 through 09/27/2023.  Medication will be shipped to patient home, Patient is responsible to answer the phone for his deliveries, and call for his own refills.   Clearance Coots, CPhT Specialty Pharmacy Patient [facility] for Infectious Disease Phone: [phone] Fax:  [phone]

## 2023-07-10 ENCOUNTER — Inpatient Hospital Stay: Payer: Self-pay | Admitting: Hematology and Oncology

## 2023-07-10 ENCOUNTER — Inpatient Hospital Stay: Payer: Self-pay | Attending: Nurse Practitioner

## 2023-07-10 NOTE — Assessment & Plan Note (Deleted)
 Lab review: 04/21/2023 revealed a platelet count of 52, hemoglobin of 11.6 and a white count of 4.3. 05/29/2023: Platelet count 48, hemoglobin 12.4, WBC 2.7 05/31/2023: Platelet count 25 (after transfusion on 05/30/2023), hemoglobin 7.8, WBC 3.5 Differential: Liver dysfunction versus infection  Elevated PT and INR: Secondary to liver cirrhosis Hospitalization 05/29/2023-06/02/2023: Acute colitis/cholelithiasis, cirrhosis, pancytopenia

## 2023-07-11 ENCOUNTER — Ambulatory Visit: Payer: Self-pay | Admitting: Physician Assistant

## 2023-07-22 ENCOUNTER — Ambulatory Visit: Payer: Self-pay | Admitting: Internal Medicine

## 2023-07-31 ENCOUNTER — Ambulatory Visit: Payer: Self-pay | Admitting: Internal Medicine

## 2023-07-31 ENCOUNTER — Telehealth: Payer: Self-pay

## 2023-07-31 NOTE — Telephone Encounter (Signed)
 Attempted to call patient to reschedule no show from today with Dr Renold Don.Call could not be completed. Sent Mychart message.

## 2023-09-01 ENCOUNTER — Ambulatory Visit: Payer: Self-pay | Admitting: Nurse Practitioner

## 2023-10-10 ENCOUNTER — Telehealth: Payer: Self-pay

## 2023-10-10 NOTE — Telephone Encounter (Signed)
 Thank you Jacqlyn Matas - will keep him on our radar.

## 2023-10-10 NOTE — Telephone Encounter (Signed)
 Received call from [facility] pharmacy stating they've been unable to get in touch with patient to deliver his Epclusa  and are requesting additional contact info. Provided them with phone number we have on file, but relayed that it appears our office has also been unable to get in touch with the patient.   Joyann Spidle, BSN, RN

## 2023-10-10 NOTE — Telephone Encounter (Signed)
 Thank you :)

## 2023-10-23 ENCOUNTER — Telehealth: Payer: Self-pay

## 2023-10-23 NOTE — Transitions of Care (Post Inpatient/ED Visit) (Signed)
   10/23/2023  Name: Alexander Mckenzie MRN: [id number] DOB: [birth year]  Today's TOC FU Call Status: Today's TOC FU Call Status:: Unsuccessful Call (1st Attempt) Unsuccessful Call (1st Attempt) Date: 10/23/23  Attempted to reach the patient regarding the most recent Inpatient/ED visit.  Follow Up Plan: Additional outreach attempts will be made to reach the patient to complete the Transitions of Care (Post Inpatient/ED visit) call.   Signature  [employer], [place]

## 2023-10-27 ENCOUNTER — Telehealth: Payer: Self-pay

## 2023-10-27 NOTE — Transitions of Care (Post Inpatient/ED Visit) (Signed)
   10/27/2023  Name: Mathews Stuhr MRN: [id number] DOB: 1980/12/29  Today's TOC FU Call Status: Today's TOC FU Call Status:: Unsuccessful Call (2nd Attempt) Unsuccessful Call (2nd Attempt) Date: 10/27/23  Attempted to reach the patient regarding the most recent Inpatient/ED visit.  Follow Up Plan: Additional outreach attempts will be made to reach the patient to complete the Transitions of Care (Post Inpatient/ED visit) call.   Signature  [employer], [place]

## 2023-10-28 ENCOUNTER — Telehealth: Payer: Self-pay

## 2023-10-28 NOTE — Transitions of Care (Post Inpatient/ED Visit) (Signed)
   10/28/2023  Name: Alexander Mckenzie MRN: [id number] DOB: [birth year]  Today's TOC FU Call Status: Today's TOC FU Call Status:: Unsuccessful Call (3rd Attempt) Unsuccessful Call (3rd Attempt) Date: 10/28/23  Attempted to reach the patient regarding the most recent Inpatient/ED visit.  Follow Up Plan: No further outreach attempts will be made at this time. We have been unable to contact the patient.  Signature  [employer], [place]

## 2023-11-25 ENCOUNTER — Telehealth: Payer: Self-pay

## 2023-11-25 NOTE — Transitions of Care (Post Inpatient/ED Visit) (Signed)
   11/25/2023  Name: Alexander Mckenzie MRN: [id number] DOB: May 26, 1980  Today's TOC FU Call Status: Unsuccessful Call (1st Attempt) Date: 11/25/23  Attempted to reach the patient regarding the most recent Inpatient/ED visit.  Follow Up Plan: Additional outreach attempts will be made to reach the patient to complete the Transitions of Care (Post Inpatient/ED visit) call.   Signature  [employer], [place]

## 2023-11-26 ENCOUNTER — Telehealth: Payer: Self-pay

## 2023-11-26 NOTE — Transitions of Care (Post Inpatient/ED Visit) (Signed)
   11/26/2023  Name: Alexander Mckenzie MRN: [id number] DOB: 02-May-1981  Today's TOC FU Call Status: Unsuccessful Call (2nd Attempt) Date: 11/26/23  Attempted to reach the patient regarding the most recent Inpatient/ED visit.  Follow Up Plan: Additional outreach attempts will be made to reach the patient to complete the Transitions of Care (Post Inpatient/ED visit) call.   Signature  [employer], [place]

## 2023-12-01 ENCOUNTER — Telehealth: Payer: Self-pay

## 2023-12-01 NOTE — Transitions of Care (Post Inpatient/ED Visit) (Signed)
   12/01/2023  Name: Alexander Mckenzie MRN: [id number] DOB: 1981/01/25  Today's TOC FU Call Status: Today's TOC FU Call Status:: Unsuccessful Call (3rd Attempt) Unsuccessful Call (3rd Attempt) Date: 12/01/23  Attempted to reach the patient regarding the most recent Inpatient/ED visit.  Follow Up Plan: No further outreach attempts will be made at this time. We have been unable to contact the patient.  Signature  [employer], [place]

## 2023-12-12 ENCOUNTER — Telehealth: Payer: Self-pay

## 2023-12-12 NOTE — Telephone Encounter (Signed)
 [facility] Patient Advocate Encounter   I have been unsuccessful in reaching patient to be able to set appointment to come back to follow up for Hep-C.  I called [employer] and was told patient received epclusa  on the following dates 07/01/23, 07/25/23, 08/20/23, 09/17/23, 10/16/23 & 11/24/23 . Patient received 6 months of medication.  I tried calling him again today and his phone number is out of service.    We have tried multiple times without a response.  Arland Hutchinson, CPhT Specialty Pharmacy Patient [facility] for Infectious Disease Phone: [phone] Fax:  [phone]

## 2023-12-12 NOTE — Telephone Encounter (Signed)
 FYI - We have not been able to reach patient, but he apparently has received Epclusa  for 6 months. He visited [facility] ED in July for worsening ascites.

## 2023-12-14 NOTE — Telephone Encounter (Signed)
 Thank you :)
